# Patient Record
Sex: Female | Born: 1955
Health system: Southern US, Community
[De-identification: ages and names within clinical notes are randomized; demographics above are authoritative.]

## PROBLEM LIST (undated history)

## (undated) DIAGNOSIS — E079 Disorder of thyroid, unspecified: Secondary | ICD-10-CM

## (undated) DIAGNOSIS — C801 Malignant (primary) neoplasm, unspecified: Secondary | ICD-10-CM

## (undated) DIAGNOSIS — E271 Primary adrenocortical insufficiency: Secondary | ICD-10-CM

## (undated) DIAGNOSIS — E785 Hyperlipidemia, unspecified: Secondary | ICD-10-CM

## (undated) DIAGNOSIS — Z5189 Encounter for other specified aftercare: Secondary | ICD-10-CM

## (undated) HISTORY — DX: Disorder of thyroid, unspecified: E07.9

## (undated) HISTORY — PX: KIDNEY SURGERY: SHX687

## (undated) HISTORY — DX: Hyperlipidemia, unspecified: E78.5

## (undated) HISTORY — DX: Malignant (primary) neoplasm, unspecified: C80.1

## (undated) HISTORY — DX: Encounter for other specified aftercare: Z51.89

## (undated) HISTORY — DX: Primary adrenocortical insufficiency: E27.1

---

## 1980-08-10 DIAGNOSIS — Z5189 Encounter for other specified aftercare: Secondary | ICD-10-CM

## 1980-08-10 DIAGNOSIS — IMO0001 Reserved for inherently not codable concepts without codable children: Secondary | ICD-10-CM

## 1980-08-10 HISTORY — DX: Reserved for inherently not codable concepts without codable children: IMO0001

## 1980-08-10 HISTORY — DX: Encounter for other specified aftercare: Z51.89

## 1982-08-10 HISTORY — PX: TUBAL LIGATION: SHX77

## 2004-03-26 ENCOUNTER — Other Ambulatory Visit: Payer: Self-pay

## 2005-12-09 ENCOUNTER — Emergency Department: Payer: Self-pay | Admitting: Emergency Medicine

## 2007-08-11 HISTORY — PX: NEPHRECTOMY: SHX65

## 2007-09-04 ENCOUNTER — Observation Stay: Payer: Self-pay | Admitting: Urology

## 2007-09-26 ENCOUNTER — Ambulatory Visit (HOSPITAL_COMMUNITY): Admission: RE | Admit: 2007-09-26 | Discharge: 2007-09-26 | Payer: Self-pay | Admitting: Urology

## 2007-10-10 ENCOUNTER — Encounter (INDEPENDENT_AMBULATORY_CARE_PROVIDER_SITE_OTHER): Payer: Self-pay | Admitting: Urology

## 2007-10-10 ENCOUNTER — Inpatient Hospital Stay (HOSPITAL_COMMUNITY): Admission: RE | Admit: 2007-10-10 | Discharge: 2007-10-14 | Payer: Self-pay | Admitting: Urology

## 2007-10-15 ENCOUNTER — Emergency Department (HOSPITAL_COMMUNITY): Admission: EM | Admit: 2007-10-15 | Discharge: 2007-10-15 | Payer: Self-pay | Admitting: Emergency Medicine

## 2007-10-16 ENCOUNTER — Ambulatory Visit (HOSPITAL_COMMUNITY): Admission: RE | Admit: 2007-10-16 | Discharge: 2007-10-16 | Payer: Self-pay | Admitting: Emergency Medicine

## 2007-10-16 ENCOUNTER — Ambulatory Visit: Payer: Self-pay | Admitting: Vascular Surgery

## 2007-10-16 ENCOUNTER — Encounter (INDEPENDENT_AMBULATORY_CARE_PROVIDER_SITE_OTHER): Payer: Self-pay | Admitting: Emergency Medicine

## 2008-05-02 ENCOUNTER — Ambulatory Visit (HOSPITAL_COMMUNITY): Admission: RE | Admit: 2008-05-02 | Discharge: 2008-05-02 | Payer: Self-pay | Admitting: Urology

## 2008-06-26 IMAGING — US ULTRASOUND CORE BIOPSY
1 series · 17 of 18 positions shown · non-contrast
Comparison: none

REASON FOR EXAM: kidney aspiration
COMMENTS:

[Series 1: ultrasound core biopsy · 17 of 18 slices shown]
[im 1/18]
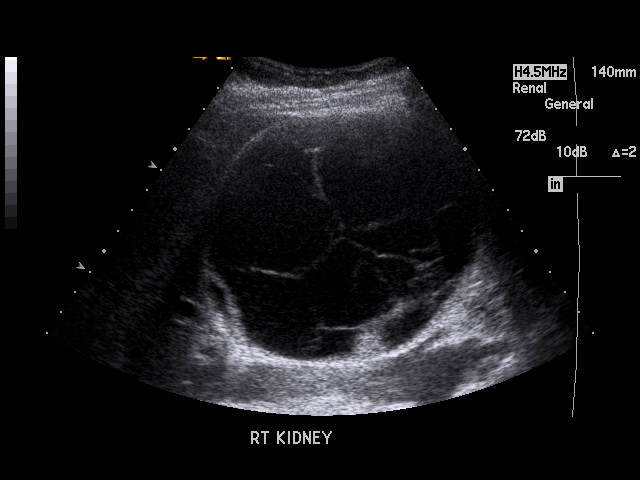
[im 2/18]
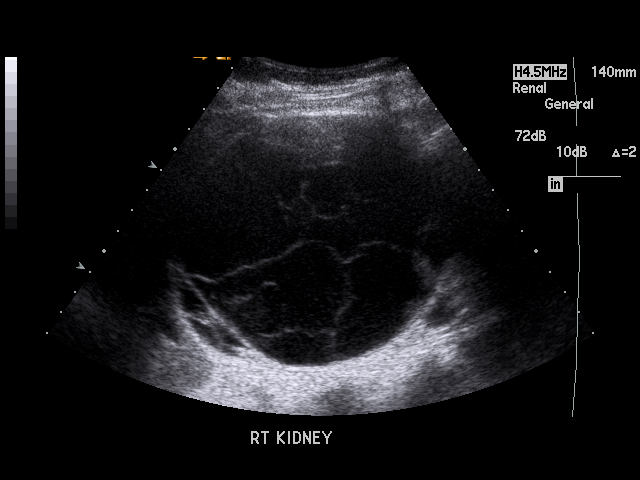
[im 3/18]
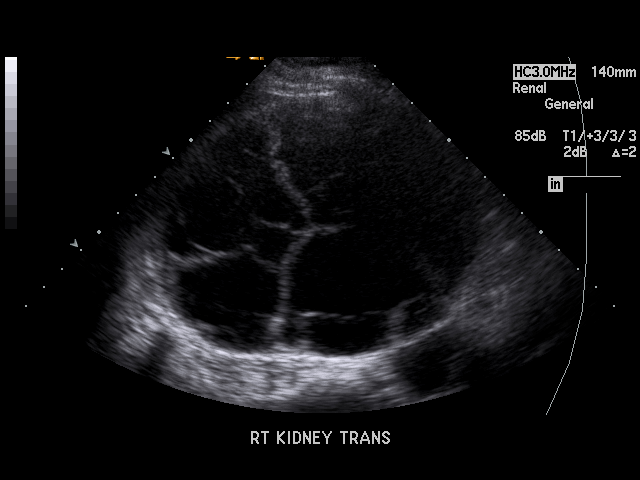
[im 4/18]
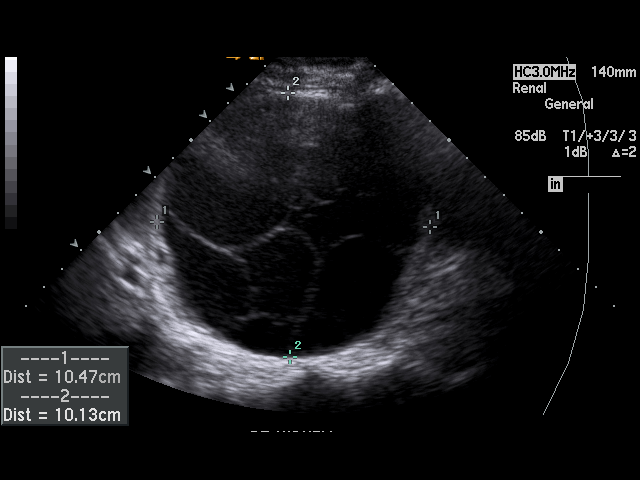
[im 5/18]
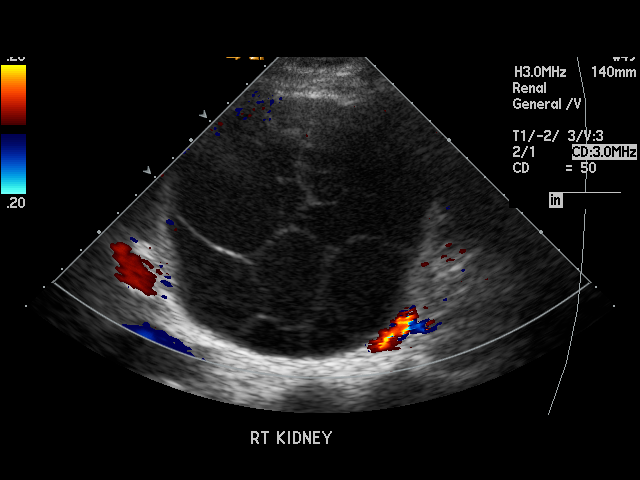
[im 6/18]
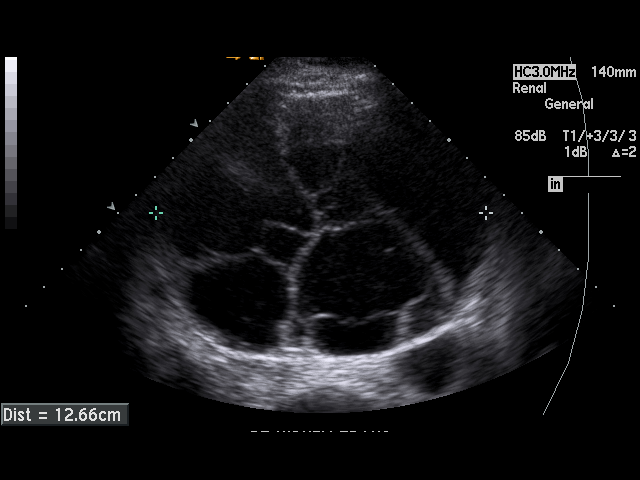
[im 7/18]
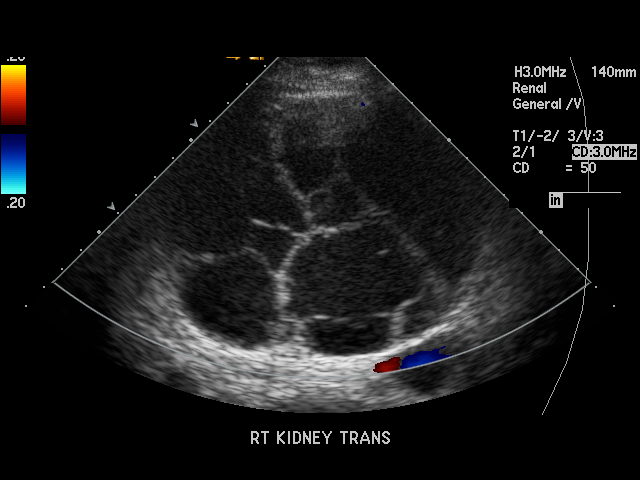
[im 8/18]
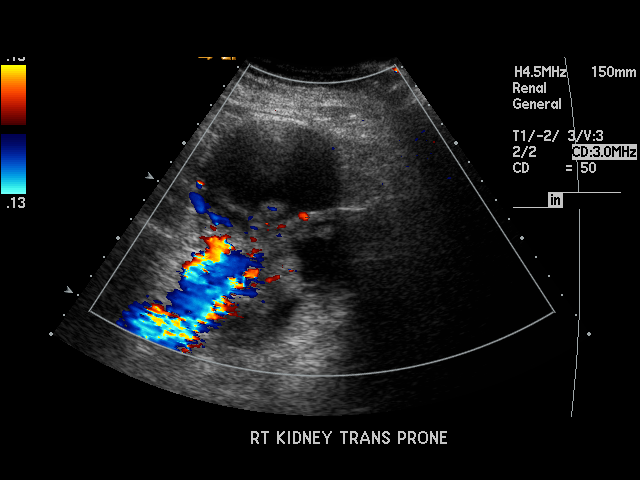
[im 10/18]
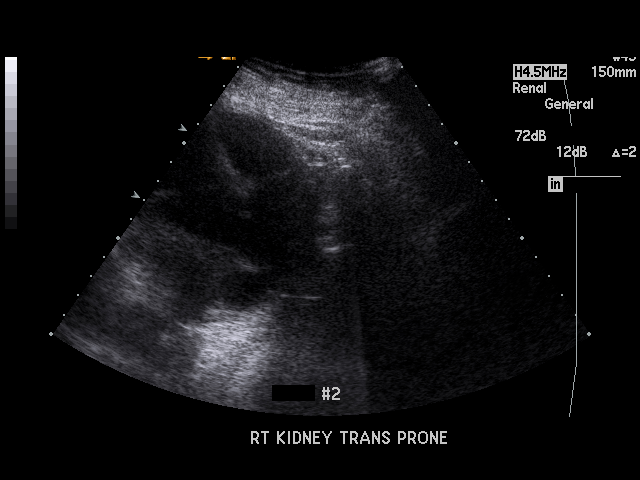
[im 11/18]
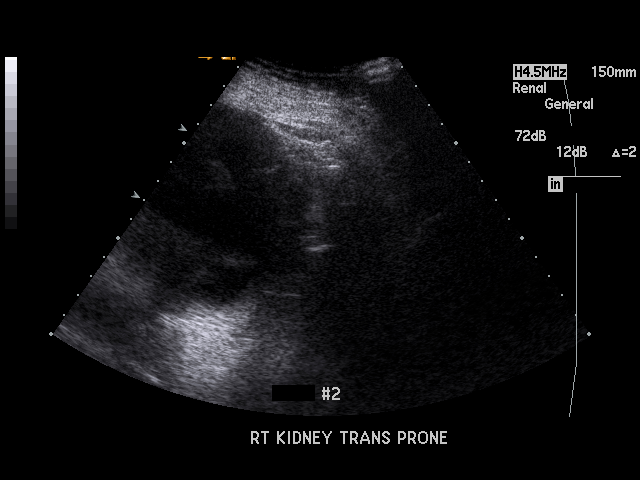
[im 12/18]
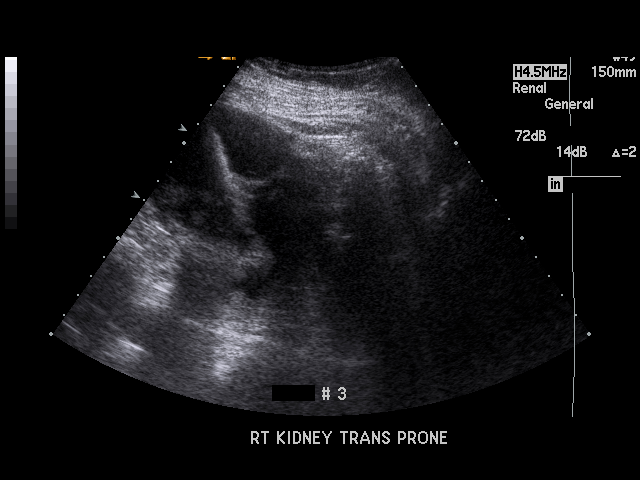
[im 13/18]
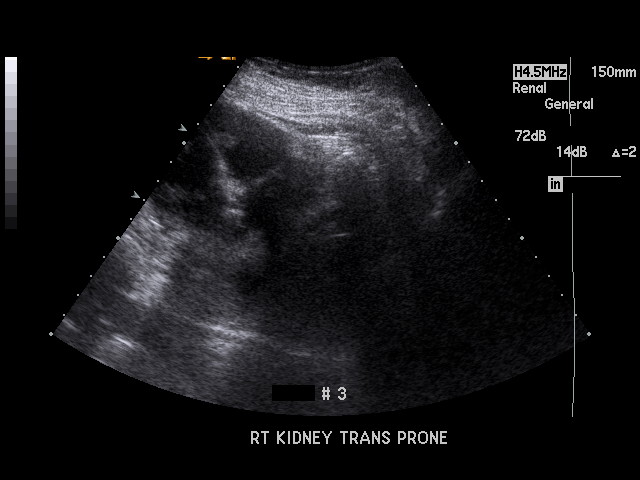
[im 14/18]
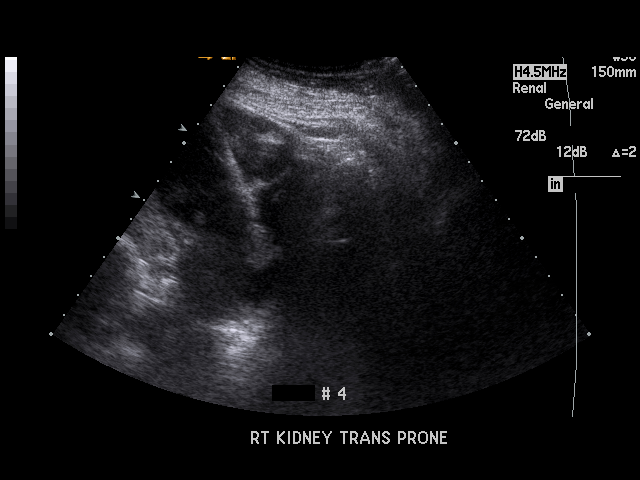
[im 15/18]
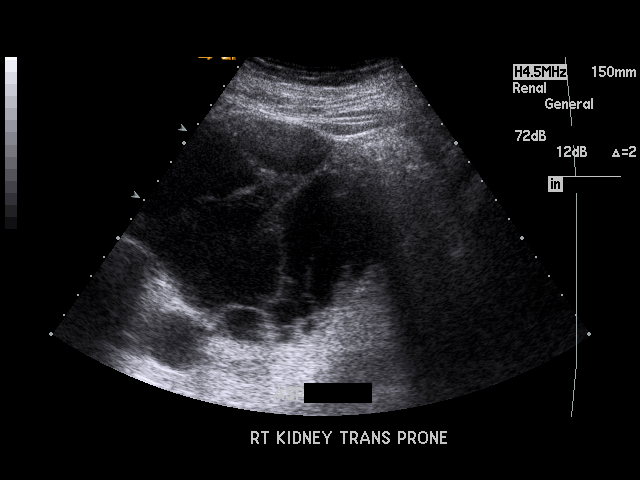
[im 16/18]
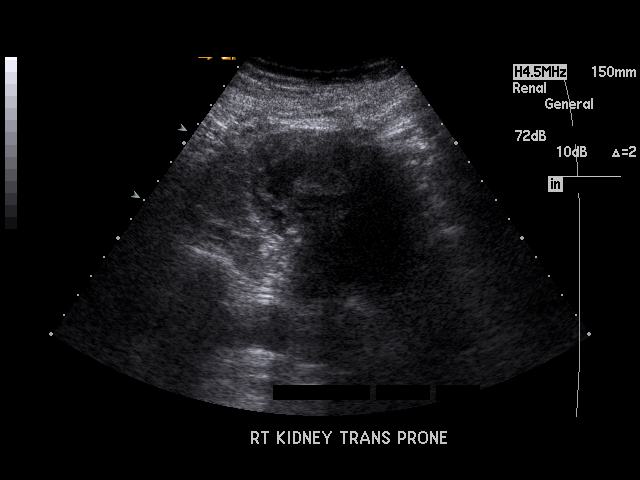
[im 17/18]
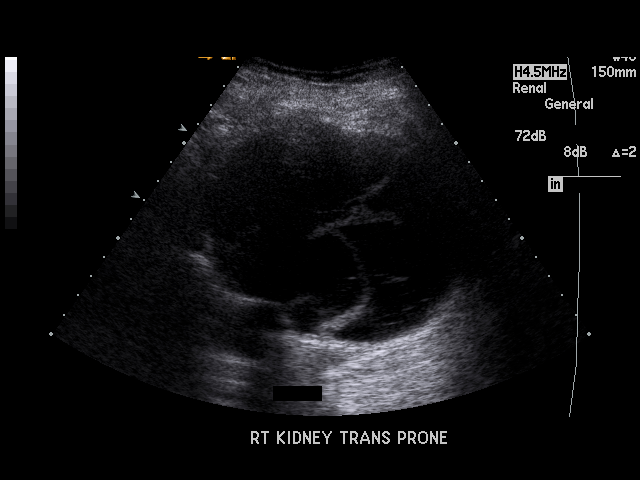
[im 18/18]
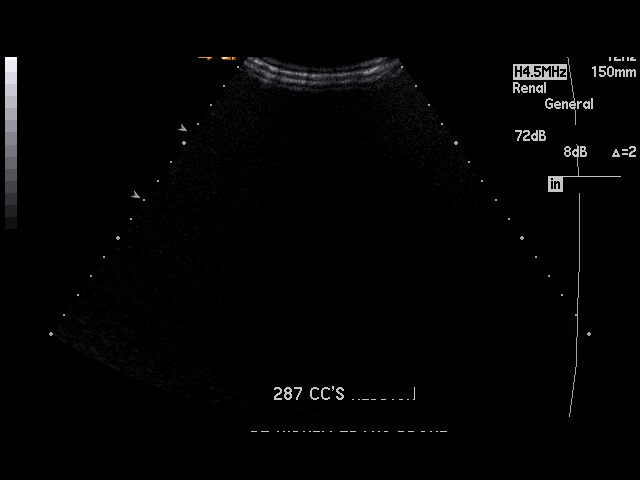

[17 of 18 positions shown; findings below may reference images not displayed]

PROCEDURE:     US  - US GUIDED BX/ASPIRATION NOT BR  - September 06, 2007  [DATE]

RESULT:     Comparison: Abdominal sonogram and abdomen pelvis CT on
09/04/2007.

Procedure/findings:

The procedure, risks, benefits, and alternatives were discussed with the
patient. Informed consent was obtained.

Patient was placed prone. Initial ultrasound of right kidney again shows a
large multiseptated cystic lesion along the upper pole. The lower portion of
the lesion is targeted for biopsy and aspiration via posterior approach.

Conscious sedation was initiated. The right back was prepped and draped in
the usual sterile fashion. Local anesthesia with 1% lidocaine was given.
Under ultrasound guidance, a 17-gauge introducer was positioned into the
lower portion of the complex cystic right upper pole lesion adjacent to a
thickened, vascular septa.  Four passes were made through the thickened
septa with an 18-gauge biopsy device resulting several small tissue
fragments. Samples were placed in formalin and sent to pathology for further
evaluation.

Subsequently the cystic lesion was aspirated using the introducer.
Approximately 290 cc of dark red fluid was obtained after multiple
introducer position changes. The lower portion of the lesion is mostly
decompressed while the upper portion is similar to prior. Portion of the
fluid was sent to the pathology lab for further evaluation.

The needle was removed. Patient tolerated procedure without immediate
complications.

IV medications: Versed 5 mg, Fentanyl 100 mcg.
IMPRESSION: 1. Core biopsy and aspiration of large complex, multiseptated, cystic right
upper pole renal lesion are performed as detailed above.

## 2008-11-02 ENCOUNTER — Ambulatory Visit (HOSPITAL_COMMUNITY): Admission: RE | Admit: 2008-11-02 | Discharge: 2008-11-02 | Payer: Self-pay | Admitting: Urology

## 2009-05-08 ENCOUNTER — Ambulatory Visit (HOSPITAL_COMMUNITY): Admission: RE | Admit: 2009-05-08 | Discharge: 2009-05-08 | Payer: Self-pay | Admitting: Urology

## 2010-03-18 ENCOUNTER — Ambulatory Visit: Payer: Self-pay | Admitting: General Practice

## 2010-03-25 ENCOUNTER — Ambulatory Visit: Payer: Self-pay | Admitting: General Practice

## 2010-04-12 ENCOUNTER — Ambulatory Visit: Payer: Self-pay | Admitting: Internal Medicine

## 2010-07-01 ENCOUNTER — Ambulatory Visit (HOSPITAL_COMMUNITY)
Admission: RE | Admit: 2010-07-01 | Discharge: 2010-07-01 | Payer: Self-pay | Source: Home / Self Care | Admitting: Urology

## 2010-10-21 ENCOUNTER — Ambulatory Visit: Payer: Self-pay | Admitting: General Surgery

## 2010-12-23 NOTE — Discharge Summary (Signed)
NAMEVENTURA, LEGGITT NO.:  [id number]   MEDICAL RECORD NO.:  [id number]          PATIENT TYPE:  INP   LOCATION:  1423                         FACILITY:  [facility]   PHYSICIAN:  Heloise Purpura, MD      DATE OF BIRTH:  10/28/1955   DATE OF ADMISSION:  10/10/2007  DATE OF DISCHARGE:  10/14/2007                               DISCHARGE SUMMARY   ADMISSION DIAGNOSIS:  Right renal mass.   DISCHARGE DIAGNOSIS:  Renal cell carcinoma.   HISTORY AND PHYSICAL:  For full details please see admission history and  physical.  Briefly, Ms. Hunnicutt is a 55 year old female who was found to  have a large cystic complex right renal mass on an evaluation for  abdominal pain.  She underwent a biopsy of this which demonstrated  findings concerning for renal cell carcinoma.  A metastatic evaluation  was negative and after discussion regarding treatment options she  elected to proceed with a right nephrectomy.   HOSPITAL COURSE:  On October 10, 2007, the patient was taken to the  operating room and underwent a right radical nephrectomy.  She tolerated  this procedure well and without complications.  Postoperatively, she was  able to be transferred to a regular hospital room following recovery  from anesthesia.  She remained hemodynamically stable and her hemoglobin  on postoperative day #1 was 10.4.  Her renal function remained stable  with a serum creatinine ranging between 0.8 and 0.90.  She had an  epidural catheter placed for pain management which provided excellent  postoperative pain control.  On postoperative day #2, she began to have  some signs of return of bowel function and was begun on clear liquid  diet; however, she did experience some nausea and, therefore, her diet  was held on the evening of postoperative day #2.  On postoperative day  #3, she had began ambulating as her epidural catheter was removed.  Her  Foley catheter was also subsequently removed.  She did begin passing  flatus and actually had a bowel movement on postoperative day #3.  Her  diet was advanced to a full liquid diet on the afternoon of  postoperative day #3, which she tolerated without difficulty.  Her pain  was controlled with oral pain medication and by the morning of  postoperative day #4, she was able to tolerate a regular diet and was  ready to be discharged home.   Ms. Kubota did receive perioperative stress dose steroids due to her  history of chronic steroid use for adrenal insufficiency.  This was  managed under the care of Dr. Adrian Prince.   PATHOLOGY:  Ms. Cranmore' pathology did return while she was in the  hospital and demonstrated an organ-confined pT2 NX MX Fuhrman grade 2  clear cell renal cell carcinoma with negative margins.  This was  discussed with the patient during her hospitalization.   DISPOSITION:  Home.   DISCHARGE MEDICATIONS:  She was instructed to resume her regular home  medications.  She was instructed to continue taking 40 mg of  hydrocortisone in the morning with 20 mg  in the evening, as instructed  by Dr. Evlyn Kanner.  Further changes of her hydrocortisone will be managed by  Dr. Evlyn Kanner as an outpatient.  She was given a prescription to take  Percocet as needed for pain and Colace as a stool softener.  In  addition, due to complaints of gastroesophageal reflux, she was given a  prescription for Nexium and will plan to follow up with Dr. Evlyn Kanner for  further evaluation of this as an outpatient.   DISCHARGE INSTRUCTIONS:  She was instructed to be ambulatory but  specifically told to refrain from any heavy lifting, strenuous activity,  or driving.  She was instructed to continue to gradually advance her  diet over the course of the next few days.   FOLLOW UP:  Ms. Rather will follow up for further postoperative  evaluation and removal of her staples in approximately 10-14 days.      Heloise Purpura, MD  Electronically Signed     LB/MEDQ  D:  10/15/2007   T:  10/17/2007  Job:  [id number]   cc:   Tera Mater. Evlyn Kanner, M.D.  Fax: [phone]

## 2010-12-23 NOTE — Op Note (Signed)
NAMEAMARIYANA, HEACOX NO.:  [id number]   MEDICAL RECORD NO.:  [id number]          PATIENT TYPE:  INP   LOCATION:  1423                         [facility]AC[facility]   PHYSICIAN:  Heloise Purpura, MD      DATE OF BIRTH:  1956/08/10   DATE OF PROCEDURE:  DATE OF DISCHARGE:                               OPERATIVE REPORT   PREOPERATIVE DIAGNOSIS:  Right renal mass.   POSTOPERATIVE DIAGNOSIS:  Right renal mass.   PROCEDURE:  Right radical nephrectomy.   SURGEON:  Dr. Heloise Purpura.   ASSISTANT:  Dr. Delman Kitten.   ANESTHESIA:  General.   COMPLICATIONS:  None.   ESTIMATED BLOOD LOSS:  200 mL.   INTRAVENOUS FLUIDS:  3400 mL of lactated Ringer's.   SPECIMENS:  Right kidney and proximal ureter.   DISPOSITION OF SPECIMEN:  To pathology.   INDICATIONS:  Ms. Templeton is a 55 year old female who was incidentally  found to have a large cystic right renal mass.  She underwent a biopsy  of the cystic mass which was suspicious for a renal cell carcinoma.  She  underwent metastatic evaluation which was negative and after discussing  options for management of her renal mass, elected to proceed with the  above procedure.  Potential risks, benefits, complications, and  alternative options were discussed with the patient in detail and  informed consent was obtained.   DESCRIPTION OF PROCEDURE:  The patient was taken to the operating room  after an epidural was placed for postoperative pain control.  She was  administered preoperative antibiotics and placed in the supine position  with a slight bump under her right posterior costal margin.  A general  anesthetic was induced and she was prepped and draped in the usual  sterile fashion.  A preoperative time-out was performed.  A chevron  incision was then made from the tip of the eleventh rib approximately  two fingerbreadths under the costal margin just across the midline.  This was carried down through the subcutaneous tissues  with  electrocautery.  The anterior rectus fascia and external oblique fascia  were then divided with electrocautery and the underlying muscle layers  of the abdominal wall were divided.  The posterior rectus sheath was  then sharply divided allowing entry into the peritoneal cavity.  Toward  the midline, the falciform ligament was identified and was clamped with  Kelly clamps and divided.  The falciform ligament was then tied off with  0-0 silk sutures.  The incision was then opened along the length of the  skin incision and a self-retaining Bookwalter retractor was placed. The  abdomen was inspected and there was no evidence for intra-abdominal  disease or lymphadenopathy.  The colon was identified and the white line  of Toldt was incised along the length of the ascending colon allowing  the colon to be mobilized medially in the space between Gerota's fascia  and the colonic mesentery to be developed.  The duodenum was identified  and was noted to be fairly adherent to the superior pole renal cyst  which measured approximately 15 cm and corresponded  to the patient's  preoperative MRI.  The duodenum was carefully dissected free utilizing a  Kocher maneuver.  Once the duodenum was reflected, this exposed the  inferior vena cava.  The ureter was identified and lifted anteriorly off  the psoas muscle, allowing the posterior aspect of the kidney to be free  from the underlying psoas muscle.  Dissection proceeded superiorly along  the inferior vena cava and the renal vein was identified and was  significantly displaced due to the cystic mass.  The renal vein was able  to be dissected free using right angle dissection and was isolated.  A  single renal artery was noted on preoperative imaging and this was able  to be palpated just posterior to the renal vein.  It was able to be  isolated with right-angle dissection and was divided after ligation with  multiple 0-0 silk ties and a Hem-o-lok  clip.  This resulted in ischemia  of the kidney and the renal vein was then similarly divided with  multiple 0-0 silk ties and Hem-o-lok clips.  The ureter was divided  between Hem-o-lok clips and the lateral attachments to the kidney were  carefully taken down.  Dissection, continued superiorly along the  inferior vena cava and the cystic mass was freed superiorly allowing the  adrenal gland to be spared as the cystic mass did not appear to be  invading the adrenal gland or in close proximity to the adrenal gland.  The superior attachments and peritoneum of the kidney including the  hepatorenal ligaments were divided and the specimen was removed.  The  renal fossa was copiously irrigated with warm saline irrigation and  hemostasis appeared excellent.  The colon was then allowed to fall back  into its normal anatomic position in the right renal fossa.  Preparations were then made for closure.  A #1 PDS suture was used to  perform a U stitch in the midline of the abdominal wall to reapproximate  the midline.  A running #1 PDS suture was then used to close the  transversalis fascia and internal oblique fascial layers in continuity  with the posterior rectus sheath.  A second anterior layer was then  performed with a running #1 PDS suture to reapproximate the external  oblique fascia in continuity with the anterior rectus sheath.  The  superficial wound was copiously irrigated and reapproximated at the skin  level with staples.  Sterile dressings were applied.  The patient  appeared to tolerate the procedure well and without complications.  She  was able to be extubated and transferred to the recovery unit in  satisfactory condition.      Heloise Purpura, MD  Electronically Signed     LB/MEDQ  D:  10/10/2007  T:  10/11/2007  Job:  [id number]

## 2011-05-01 LAB — BASIC METABOLIC PANEL
Calcium: 8.8
GFR calc Af Amer: >60
Potassium: 3.3 — ABNORMAL LOW
Sodium: 141

## 2011-05-01 LAB — CBC
MCHC: 33.8
MCV: 86.7
Platelets: 369
RDW: 14.1
WBC: 11.1 — ABNORMAL HIGH

## 2011-05-01 LAB — TYPE AND SCREEN: ABO/RH(D): A POS

## 2011-05-04 LAB — BASIC METABOLIC PANEL
BUN: 1 — ABNORMAL LOW
BUN: 1 — ABNORMAL LOW
BUN: 2 — ABNORMAL LOW
BUN: 3 — ABNORMAL LOW
BUN: <1 — ABNORMAL LOW
CO2: 27
CO2: 29
CO2: 29
CO2: 29
CO2: 32
Calcium: 7.8 — ABNORMAL LOW
Calcium: 7.9 — ABNORMAL LOW
Calcium: 8 — ABNORMAL LOW
Calcium: 8.2 — ABNORMAL LOW
Chloride: 102
Chloride: 103
Chloride: 103
Chloride: 105
Chloride: 109
Creatinine, Ser: 0.83
Creatinine, Ser: 0.84
Creatinine, Ser: 0.9
Creatinine, Ser: 0.91
GFR calc Af Amer: >60
GFR calc Af Amer: >60
GFR calc Af Amer: >60
GFR calc Af Amer: >60
GFR calc non Af Amer: >60
GFR calc non Af Amer: >60
GFR calc non Af Amer: >60
GFR calc non Af Amer: >60
Glucose, Bld: 114 — ABNORMAL HIGH
Glucose, Bld: 116 — ABNORMAL HIGH
Glucose, Bld: 130 — ABNORMAL HIGH
Glucose, Bld: 76
Glucose, Bld: 96
Potassium: 2.9 — ABNORMAL LOW
Potassium: 3 — ABNORMAL LOW
Potassium: 3.1 — ABNORMAL LOW
Potassium: 3.5
Potassium: 3.7
Sodium: 138
Sodium: 141
Sodium: 142
Sodium: 142

## 2011-05-04 LAB — HEMOGLOBIN AND HEMATOCRIT, BLOOD: HCT: 30.1 — ABNORMAL LOW

## 2011-08-30 ENCOUNTER — Inpatient Hospital Stay: Payer: Self-pay | Admitting: Student

## 2011-08-30 LAB — CBC
HCT: 41.1 % (ref 35.0–47.0)
HGB: 14.1 g/dL (ref 12.0–16.0)
MCH: 31.1 pg (ref 26.0–34.0)
MCV: 91 fL (ref 80–100)
Platelet: 184 10*3/uL (ref 150–440)
RBC: 4.53 10*6/uL (ref 3.80–5.20)
WBC: 8.2 10*3/uL (ref 3.6–11.0)

## 2011-08-30 LAB — URINALYSIS, COMPLETE
Bilirubin,UR: NEGATIVE
Blood: NEGATIVE
Glucose,UR: NEGATIVE mg/dL (ref 0–75)
Hyaline Cast: 1
Leukocyte Esterase: NEGATIVE
Ph: 5 (ref 4.5–8.0)
Protein: NEGATIVE
Specific Gravity: 1.019 (ref 1.003–1.030)
Squamous Epithelial: <1
WBC UR: 1 /HPF (ref 0–5)

## 2011-08-30 LAB — DRUG SCREEN, URINE
Amphetamines, Ur Screen: NEGATIVE (ref ?–1000)
Benzodiazepine, Ur Scrn: NEGATIVE (ref ?–200)
Cannabinoid 50 Ng, Ur ~~LOC~~: NEGATIVE (ref ?–50)
MDMA (Ecstasy)Ur Screen: NEGATIVE (ref ?–500)
Methadone, Ur Screen: NEGATIVE (ref ?–300)
Phencyclidine (PCP) Ur S: NEGATIVE (ref ?–25)
Tricyclic, Ur Screen: NEGATIVE (ref ?–1000)

## 2011-08-30 LAB — COMPREHENSIVE METABOLIC PANEL
Creatinine: 1.57 mg/dL — ABNORMAL HIGH (ref 0.60–1.30)
EGFR (African American): 44 — ABNORMAL LOW
EGFR (Non-African Amer.): 36 — ABNORMAL LOW
Glucose: 85 mg/dL (ref 65–99)
Potassium: 3.5 mmol/L (ref 3.5–5.1)
Sodium: 135 mmol/L — ABNORMAL LOW (ref 136–145)

## 2011-08-30 LAB — ETHANOL
Ethanol %: <0.003 % (ref 0.000–0.080)
Ethanol: <3 mg/dL

## 2011-08-30 LAB — TSH: Thyroid Stimulating Horm: 3.57 u[IU]/mL

## 2011-08-30 LAB — RAPID INFLUENZA A&B ANTIGENS

## 2011-08-30 LAB — TROPONIN I: Troponin-I: <0.02 ng/mL

## 2011-08-31 LAB — CBC WITH DIFFERENTIAL/PLATELET
Basophil #: 0 10*3/uL (ref 0.0–0.1)
Basophil %: 0 %
Eosinophil #: 0 10*3/uL (ref 0.0–0.7)
Eosinophil %: 0 %
HCT: 35.9 % (ref 35.0–47.0)
HGB: 12.3 g/dL (ref 12.0–16.0)
Lymphocyte #: 0.7 10*3/uL — ABNORMAL LOW (ref 1.0–3.6)
Lymphocyte %: 7.8 %
MCHC: 34.2 g/dL (ref 32.0–36.0)
MCV: 90 fL (ref 80–100)
Monocyte #: 0.3 10*3/uL (ref 0.0–0.7)
Monocyte %: 3.4 %
Neutrophil %: 88.8 %
RBC: 3.98 10*6/uL (ref 3.80–5.20)
WBC: 8.5 10*3/uL (ref 3.6–11.0)

## 2011-08-31 LAB — BASIC METABOLIC PANEL
Anion Gap: 13 (ref 7–16)
BUN: 14 mg/dL (ref 7–18)
Chloride: 109 mmol/L — ABNORMAL HIGH (ref 98–107)
Co2: 23 mmol/L (ref 21–32)
Creatinine: 1.43 mg/dL — ABNORMAL HIGH (ref 0.60–1.30)
EGFR (African American): 49 — ABNORMAL LOW
Glucose: 193 mg/dL — ABNORMAL HIGH (ref 65–99)
Osmolality: 294 (ref 275–301)
Sodium: 145 mmol/L (ref 136–145)

## 2011-09-01 LAB — BASIC METABOLIC PANEL
Calcium, Total: 7.9 mg/dL — ABNORMAL LOW (ref 8.5–10.1)
Chloride: 111 mmol/L — ABNORMAL HIGH (ref 98–107)
Co2: 25 mmol/L (ref 21–32)
EGFR (African American): >60
Glucose: 109 mg/dL — ABNORMAL HIGH (ref 65–99)
Osmolality: 294 (ref 275–301)
Potassium: 3.5 mmol/L (ref 3.5–5.1)
Sodium: 148 mmol/L — ABNORMAL HIGH (ref 136–145)

## 2011-09-01 LAB — CBC WITH DIFFERENTIAL/PLATELET
Basophil %: 0.2 %
Eosinophil %: 0 %
HGB: 11.6 g/dL — ABNORMAL LOW (ref 12.0–16.0)
Lymphocyte #: 1.3 10*3/uL (ref 1.0–3.6)
Lymphocyte %: 12.9 %
MCHC: 33.7 g/dL (ref 32.0–36.0)
Monocyte #: 1.2 10*3/uL — ABNORMAL HIGH (ref 0.0–0.7)
Monocyte %: 11.3 %
Neutrophil %: 75.6 %
WBC: 10.2 10*3/uL (ref 3.6–11.0)

## 2011-09-05 LAB — CULTURE, BLOOD (SINGLE)

## 2011-10-05 ENCOUNTER — Ambulatory Visit: Payer: Self-pay | Admitting: General Practice

## 2011-11-03 ENCOUNTER — Encounter: Payer: Self-pay | Admitting: Internal Medicine

## 2011-12-15 ENCOUNTER — Ambulatory Visit (AMBULATORY_SURGERY_CENTER): Payer: No Typology Code available for payment source | Admitting: *Deleted

## 2011-12-15 VITALS — Ht 66.0 in | Wt 165.0 lb

## 2011-12-15 DIAGNOSIS — Z1211 Encounter for screening for malignant neoplasm of colon: Secondary | ICD-10-CM

## 2011-12-15 MED ORDER — PEG-KCL-NACL-NASULF-NA ASC-C 100 G PO SOLR: ORAL | Status: DC

## 2011-12-23 ENCOUNTER — Encounter: Payer: Self-pay | Admitting: Internal Medicine

## 2011-12-23 ENCOUNTER — Ambulatory Visit (AMBULATORY_SURGERY_CENTER): Payer: No Typology Code available for payment source | Admitting: Internal Medicine

## 2011-12-23 VITALS — BP 118/60 | HR 67 | Temp 97.8°F | Resp 20 | Ht 66.0 in | Wt 165.0 lb

## 2011-12-23 DIAGNOSIS — K5730 Diverticulosis of large intestine without perforation or abscess without bleeding: Secondary | ICD-10-CM

## 2011-12-23 DIAGNOSIS — Z1211 Encounter for screening for malignant neoplasm of colon: Secondary | ICD-10-CM

## 2011-12-23 MED ORDER — SODIUM CHLORIDE 0.9 % IV SOLN: 500.0000 mL | INTRAVENOUS | Status: DC

## 2011-12-23 NOTE — Patient Instructions (Signed)
Moderate diverticulosis, internal and external hemorrhoids.  Otherwise normal exam.  Repeat colonoscopy in 10 years.  YOU HAD AN ENDOSCOPIC PROCEDURE TODAY AT THE [facility]: Refer to the procedure report that was given to you for any specific questions about what was found during the examination.  If the procedure report does not answer your questions, please call your gastroenterologist to clarify.  If you requested that your care partner not be given the details of your procedure findings, then the procedure report has been included in a sealed envelope for you to review at your convenience later.  YOU SHOULD EXPECT: Some feelings of bloating in the abdomen. Passage of more gas than usual.  Walking can help get rid of the air that was put into your GI tract during the procedure and reduce the bloating. If you had a lower endoscopy (such as a colonoscopy or flexible sigmoidoscopy) you may notice spotting of blood in your stool or on the toilet paper. If you underwent a bowel prep for your procedure, then you may not have a normal bowel movement for a few days.  DIET: Your first meal following the procedure should be a light meal and then it is ok to progress to your normal diet.  A half-sandwich or bowl of soup is an example of a good first meal.  Heavy or fried foods are harder to digest and may make you feel nauseous or bloated.  Likewise meals heavy in dairy and vegetables can cause extra gas to form and this can also increase the bloating.  Drink plenty of fluids but you should avoid alcoholic beverages for 24 hours.  ACTIVITY: Your care partner should take you home directly after the procedure.  You should plan to take it easy, moving slowly for the rest of the day.  You can resume normal activity the day after the procedure however you should NOT DRIVE or use heavy machinery for 24 hours (because of the sedation medicines used during the test).    SYMPTOMS TO REPORT IMMEDIATELY: A  gastroenterologist can be reached at any hour.  During normal business hours, 8:30 AM to 5:00 PM Monday through Friday, call [phone].  After hours and on weekends, please call the GI answering service at [phone] who will take a message and have the physician on call contact you.   Following lower endoscopy (colonoscopy or flexible sigmoidoscopy):  Excessive amounts of blood in the stool  Significant tenderness or worsening of abdominal pains  Swelling of the abdomen that is new, acute  Fever of 100F or higher  FOLLOW UP: If any biopsies were taken you will be contacted by phone or by letter within the next 1-3 weeks.  Call your gastroenterologist if you have not heard about the biopsies in 3 weeks.  Our staff will call the home number listed on your records the next business day following your procedure to check on you and address any questions or concerns that you may have at that time regarding the information given to you following your procedure. This is a courtesy call and so if there is no answer at the home number and we have not heard from you through the emergency physician on call, we will assume that you have returned to your regular daily activities without incident.  SIGNATURES/CONFIDENTIALITY: You and/or your care partner have signed paperwork which will be entered into your electronic medical record.  These signatures attest to the fact that that the information above on your  After Visit Summary has been reviewed and is understood.  Full responsibility of the confidentiality of this discharge information lies with you and/or your care-partner.

## 2011-12-23 NOTE — Progress Notes (Signed)
Patient did not experience any of the following events: a burn prior to discharge; a fall within the facility; wrong site/side/patient/procedure/implant event; or a hospital transfer or hospital admission upon discharge from the facility. (G8907) Patient did not have preoperative order for IV antibiotic SSI prophylaxis. (G8918)  

## 2011-12-23 NOTE — Op Note (Signed)
[facility] [address] [place], [place]  [address]  COLONOSCOPY PROCEDURE REPORT  PATIENT:  Caitlin Allison, Caitlin Allison  MR#:  [id number] BIRTHDATE:  [birth year], 56 yrs. old  GENDER:  female ENDOSCOPIST:  Wilhemina Bonito. Eda Keys, MD REF. BY:  Adrian Prince, M.D. PROCEDURE DATE:  12/23/2011 PROCEDURE:  Average-risk screening colonoscopy G0121 ASA CLASS:  Class II INDICATIONS:  Routine Risk Screening MEDICATIONS:   MAC sedation, administered by CRNA, propofol (Diprivan) 270 mg IV  DESCRIPTION OF PROCEDURE:   After the risks benefits and alternatives of the procedure were thoroughly explained, informed consent was obtained.  Digital rectal exam was performed and revealed no abnormalities.   The LB CF-[id number] endoscope was introduced through the anus and advanced to the cecum, which was identified by both the appendix and ileocecal valve, without limitations.  The quality of the prep was excellent, using MoviPrep.  The instrument was then slowly withdrawn as the colon was fully examined. <<PROCEDUREIMAGES>>  FINDINGS:  The terminal ileum appeared normal.  Moderate diverticulosis was found in the sigmoid colon.  Otherwise normal colonoscopy without  polyps, masses, vascular ectasias, or inflammatory changes.   Retroflexed views in the rectum revealed internal hemorrhoids.    The time to cecum =  2  minutes. The scope was then withdrawn in  12  minutes from the cecum and the procedure completed.  COMPLICATIONS:  None  ENDOSCOPIC IMPRESSION: 1) Normal terminal ileum 2) Moderate diverticulosis in the sigmoid colon 3) Otherwise normal colonoscopy 4) Internal and external hemorrhoids  RECOMMENDATIONS: 1) Continue current colorectal screening recommendations for "routine risk" patients with a repeat colonoscopy in 10 years.  ______________________________ Wilhemina Bonito. Eda Keys, MD  CC:  Adrian Prince, MD;  The Patient  n. eSIGNED:   Wilhemina Bonito. Eda Keys at 12/23/2011 12:10  PM  [place], [phone]

## 2011-12-24 ENCOUNTER — Telehealth: Payer: Self-pay | Admitting: *Deleted

## 2011-12-24 NOTE — Telephone Encounter (Signed)
  Follow up Call-  Call back number 12/23/2011  Post procedure Call Back phone  # [phone]  Permission to leave phone message Yes     Patient questions:  Do you have a fever, pain , or abdominal swelling? no Pain Score  0 *  Have you tolerated food without any problems? yes  Have you been able to return to your normal activities? yes  Do you have any questions about your discharge instructions: Diet   no Medications  no Follow up visit  no  Do you have questions or concerns about your Care? no  Actions: * If pain score is 4 or above: No action needed, pain <4.

## 2012-02-02 ENCOUNTER — Ambulatory Visit (HOSPITAL_COMMUNITY)
Admission: RE | Admit: 2012-02-02 | Discharge: 2012-02-02 | Disposition: A | Discharge status: Home / Self Care | Payer: No Typology Code available for payment source | Source: Ambulatory Visit | Attending: Urology | Admitting: Urology

## 2012-02-02 ENCOUNTER — Other Ambulatory Visit (HOSPITAL_COMMUNITY): Payer: Self-pay | Admitting: Urology

## 2012-02-02 DIAGNOSIS — C649 Malignant neoplasm of unspecified kidney, except renal pelvis: Secondary | ICD-10-CM

## 2012-02-02 DIAGNOSIS — J438 Other emphysema: Secondary | ICD-10-CM | POA: Insufficient documentation

## 2012-10-28 ENCOUNTER — Other Ambulatory Visit (HOSPITAL_COMMUNITY): Payer: Self-pay | Admitting: Urology

## 2012-10-28 ENCOUNTER — Ambulatory Visit (HOSPITAL_COMMUNITY)
Admission: RE | Admit: 2012-10-28 | Discharge: 2012-10-28 | Disposition: A | Discharge status: Home / Self Care | Payer: No Typology Code available for payment source | Source: Ambulatory Visit | Attending: Urology | Admitting: Urology

## 2012-10-28 DIAGNOSIS — C649 Malignant neoplasm of unspecified kidney, except renal pelvis: Secondary | ICD-10-CM

## 2014-12-02 NOTE — Discharge Summary (Signed)
PATIENT NAME:  Caitlin Allison, Caitlin Allison MR#:  [id number] DATE OF BIRTH:  1956-07-26  DATE OF ADMISSION:  08/30/2011 DATE OF DISCHARGE:  09/02/2011  PRIMARY ENDOCRINOLOGIST: Dr. Reynold Bowen, [facility]   CHIEF COMPLAINT: Altered mental status, fever.   DISCHARGE DIAGNOSES:  1. Influenza. 2. Pneumonia. 3. Addison's disease with crisis. 4. Acute renal failure.  5. Hypothyroidism.   DISCHARGE MEDICATIONS:  1. Tamiflu 75 mg b.i.d. for three doses. 2. Levaquin 750 mg daily for four days. 3. Hydrocortisone 30 mg t.i.d. for two days, then 20 mg t.i.d.  4. Synthroid 137 mcg daily.   DIET: Regular diet.   ACTIVITY: As tolerated.   FOLLOW-UP: Please follow-up with your PCP within 1 to 2 weeks and check your BMP within one week. Please take increased doses of hydrocortisone when ill and discuss with your endocrinologist prior to upping your hydrocortisone.   HISTORY OF PRESENT ILLNESS: The patient is a 59 year old Caucasian female with history of hypothyroidism and Addison's disease who was brought in because of lethargy and fever. For full details, please refer to the history and physical dictated on 08/30/2011 by Dr. Tressia Miners.   The patient was found to be hypotensive and given IV fluids, initially needed pressors. She tested positive for Influenza. She was admitted to Critical Care Unit on Levophed drip. The patient was also found with a pneumonia and started on broad-spectrum antibiotics.   SIGNIFICANT LABS/STUDIES: Initial creatinine 1.57, on discharge 0.98. Troponin negative. TSH 3.57. Free thyroxine 1.12. Urine tox was negative. Initial WBC 8.2. Influenza positive for Influenza type A. Blood cultures have been negative to date. Urinalysis no evidence of infection. Cortisol level on arrival was low at 0.6. CT of the head without contrast showed no acute intracranial process. CT of the chest without contrast showing patchy areas of increased density in the posterior costophrenic  gutters bilaterally in the lingula, subsegmental atelectasis or early infiltrate. Initial x-ray of the chest shows shallow inspiration interstitial infiltrate, atelectasis versus infiltrate within the lung bases right greater than the left.   HOSPITAL COURSE: The patient was admitted to CCU and was started on IV antibiotics, Tamiflu, and IV fluids. Furthermore, Dr. Gabriel Carina from Endocrinology was consulted. She was started on IV steroids as well and initial cortisol level was low. The patient had previously been told that if there is in an illness please up the hydrocortisone, however, the patient stated that she had not done that. The patient also had mild acute renal failure which corrected with IV fluids. Blood cultures have been negative and the patient has remained afebrile. At this point, antibiotics will be tapered down to Levaquin for an additional four days. Her steroids have been tapered off from stress dose steroids to 30 mg p.o. t.i.d. and this should be continued for an additional two days and tapered off to 20 mg t.i.d. She is to follow-up with her outpatient PCP and endocrinologist. She is also to check a BMP within a week.   DISPOSITION: Home.   TOTAL TIME SPENT: 35 minutes.   CODE STATUS: The patient is FULL CODE.   ____________________________ Vivien Presto, MD sa:drc D: 09/02/2011 12:03:08 ET T: 09/02/2011 13:40:52 ET JOB#: [id number]  cc: Vivien Presto, MD, <Dictator> Dr. Reynold Bowen, [facility] MD ELECTRONICALLY SIGNED 09/08/2011 18:36

## 2014-12-02 NOTE — H&P (Signed)
PATIENT NAME:  Caitlin Allison, Caitlin Allison MR#:  [id number] DATE OF BIRTH:  [birth year]  DATE OF ADMISSION:  08/30/2011  ADMITTING PHYSICIAN: Dr. Gladstone Lighter. PRIMARY ENDOCRINOLOGIST: Dr. Reynold Bowen, [facility].  CHIEF COMPLAINT: Altered mental status and fever.   HISTORY OF PRESENT ILLNESS: Caitlin Allison is a 59 year old Caucasian female with past medical history significant for hypothyroidism and Addison's disease who was brought in from home due to lethargy and fever. The patient is very lethargic, moaning in bed, hypotensive, not able to provide any history. The patient's sister is at bedside who gives most of the history. According to the patient's sister, the patient lives with her husband at home. She has been doing fine up until this morning. She had a cold a couple of weeks ago and that seems to be improved this week. She worked in the yard yesterday. She was doing perfectly fine, but she was very sleepy this morning and felt extremely weak and could not get up until later this morning. Even after getting up, she felt hot. They did not check a temperature, but her mental status has been gradually going downhill and she became lethargic so was brought to the Emergency Room. On the way to the ER, she also had a couple of episodes of vomiting. She has been hypotensive in the Emergency Room. Initial blood pressure 95/42, but that dropped down to systolic in the 37J. She did receive IV hydrocortisone injection and also two liters of IV fluid boluses in spite of which her blood pressure remained low. Her influenza test is positive and she does have right lower lobe pneumonia. She will be admitted to Critical Care Unit for sepsis and will be on a Levophed drip.   PAST MEDICAL HISTORY:  1. Hypothyroidism.  2. Addison's disease.  3. Right renal cell carcinoma.   PAST SURGICAL HISTORY: Right nephrectomy secondary to renal cell carcinoma.   ALLERGIES TO MEDICATIONS: No known medication  allergies.   HOME MEDICATIONS: 1. Synthroid 100 mcg p.o. daily. The patient's sister is not sure of the exact dosage.  2. Hydrocortisone p.o. 20 mg p.o. in the morning and 10 mg in the evening.   SOCIAL HISTORY: Lives at home with her husband. No history of any smoking or alcohol. Works as a Engineer, structural.   FAMILY HISTORY: Positive for thyroid disease in the family. No significant cancer, heart disease or strokes in the family.   REVIEW OF SYSTEMS: Difficult to be obtained secondary to the patient's mental status.   PHYSICAL EXAMINATION:  VITAL SIGNS: Temperature 102.5 degrees, pulse 102, blood pressure 60/39. Pulse oximetry is 94% on 2 liters. Respiratory rate 28.   GENERAL: Heavily-built well nourished female lying in bed moaning and thrashing around in bed.   HEENT: Normocephalic, atraumatic. Pupils equal, round, reacting to light. Anicteric sclerae. Extraocular movements intact. Oropharynx clear without erythema, mass, or exudates. Very dry mucous membranes.   NECK: Supple. No thyromegaly, JVD, or carotid bruits. No lymphadenopathy.   LUNGS: Moving air bilaterally, diffuse rhonchi in the right middle lobe and also right lower lobe. Decreased bibasilar breath sounds. No use of accessory muscles for breathing.   CARDIOVASCULAR: S1, S2 regular rate and rhythm. No murmurs, rubs, or gallops.   ABDOMEN: Soft, nontender, nondistended. No hepatosplenomegaly. Normal bowel sounds.   EXTREMITIES: No pedal edema. No clubbing or cyanosis. 2+ dorsalis pedis pulses palpable.   SKIN: No acne, rash, or lesions.   LYMPHATICS: No cervical lymphadenopathy.   NEUROLOGIC: It is difficult to do  a complete neuro exam because of the patient's altered mental status. She is able to move all four extremities.   PSYCHOLOGICAL: She is lethargic and moaning without opening her eyes.   LABORATORY, RADIOLOGICAL AND DIAGNOSTIC DATA: WBC 8.2, hemoglobin 14.1, hematocrit 41.1, platelet count 184. Sodium 135,  potassium 3.5, chloride 99, bicarbonate 26, BUN 17, creatinine 1.57, glucose 85, calcium 8.8. ALT 18, AST 17, alkaline phosphatase 48, total bili 0.6, albumin 3.7. Influenza test is positive for influenza A. Troponin is negative. CK 54, CK-MB less than 0.04. Alcohol level is negative. Urinalysis negative for any infection.    ASSESSMENT AND PLAN: This is a 59 year old female with history of hypothyroidism and Addison's disease admitted for sepsis with fever and hypotension. Her influenza A is positive and she does have right lower lobe infiltrate on chest x-ray. She also has addisonian crisis with hypotension in spite of hydrocortisone and two liters of IV fluids. She is being admitted to Critical Care Unit for Levophed.  1. Sepsis secondary to pneumonia and also influenza. Continue IV fluids, vasopressors with Levophed antibiotics.  2. Influenza positivity. Give supportive treatment and also Tamiflu.  3. Right lower lobe pneumonia. Blood cultures. Will do broad-spectrum antibiotics with Zosyn, Levaquin and also vancomycin. Once cultures are back and her blood pressure improves and mental status is better, can roll down the antibiotics.  4. Altered mental status, likely secondary to hypotension and also sepsis. Continue to monitor while in Critical Care Unit.  5. Addison's disease with Addison's crisis. Will start IV hydrocortisone 100 mg IV every eight hours. Endocrinology consult. Will get a random cortisol level in the morning and continue to monitor.  6. Hypothyroidism. Follow-up TSH and T4 levels. Continue Synthroid for now and that can be changed to IV Levothyroxine as needed based on endocrinology recommendations.  7. Acute renal failure, probably from partly prerenal and ATN from her sepsis. Continue to monitor especially as she is status post right nephrectomy and only has left kidney.  8. Gastrointestinal and deep vein thrombosis prophylaxis. IV Protonix and TED stockings.  9. CODE STATUS: FULL  CODE.  Plan was discussed with the patient's sister and other family at bedside.   CRITICAL CARE TIME SPENT ON ADMISSION: 60 minutes.  ____________________________ Gladstone Lighter, MD rk:ap D: 08/30/2011 21:41:53 ET               T: 08/31/2011 10:14:33 ET               JOB#: [id number] cc: [facility], Dr. Reynold Bowen Gladstone Lighter MD ELECTRONICALLY SIGNED 09/04/2011 14:45
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
## 2015-07-03 ENCOUNTER — Encounter: Payer: Self-pay | Admitting: Physician Assistant

## 2015-07-03 ENCOUNTER — Ambulatory Visit: Payer: Self-pay | Admitting: Physician Assistant

## 2015-07-03 ENCOUNTER — Other Ambulatory Visit: Payer: Self-pay | Admitting: Physician Assistant

## 2015-07-03 VITALS — BP 100/79 | HR 78 | Temp 97.6°F | Ht 66.0 in | Wt 161.0 lb

## 2015-07-03 DIAGNOSIS — Z299 Encounter for prophylactic measures, unspecified: Secondary | ICD-10-CM

## 2015-07-03 DIAGNOSIS — Z1231 Encounter for screening mammogram for malignant neoplasm of breast: Secondary | ICD-10-CM

## 2015-07-03 NOTE — Progress Notes (Signed)
S: pt here for basic physical, needs yearly labs, hx of addison's disease, nonsmoker, no complaints, says feels well, taking cinnamon, d3, cortisol, and synthroid, nkda; needs yearly mammogram and flu vaccine, had colonoscopy when was 52 which was normal, ros neg  O: vitals wnl, nad,  Appears well Ent:  tms clear, nasal mucosa pink, throat wnl, neck supple no lymph Lungs: Normal effort. Lungs are clear to auscultation, no crackles or wheezes Cardiovascular: Regular rate and rhythm, no edema Abd: soft nontender bs normal all 4 quads Musculoskeletal:  Neurovascularly intact Neurological: No new neurological deficits

## 2015-07-05 LAB — CMP12+LP+TP+TSH+6AC+CBC/D/PLT
ALK PHOS: 52 IU/L (ref 39–117)
ALT: 14 IU/L (ref 0–32)
AST: 15 IU/L (ref 0–40)
Albumin/Globulin Ratio: 1.4 (ref 1.1–2.5)
Albumin: 4.3 g/dL (ref 3.5–5.5)
BASOS ABS: 0 10*3/uL (ref 0.0–0.2)
BILIRUBIN TOTAL: 0.4 mg/dL (ref 0.0–1.2)
BUN / CREAT RATIO: 16 (ref 9–23)
BUN: 15 mg/dL (ref 6–24)
Basos: 0 %
CALCIUM: 9.4 mg/dL (ref 8.7–10.2)
CHLORIDE: 99 mmol/L (ref 97–106)
CHOL/HDL RATIO: 3.4 ratio (ref 0.0–4.4)
Cholesterol, Total: 303 mg/dL — ABNORMAL HIGH (ref 100–199)
Creatinine, Ser: 0.95 mg/dL (ref 0.57–1.00)
EOS (ABSOLUTE): 0.1 10*3/uL (ref 0.0–0.4)
EOS: 1 %
ESTIMATED CHD RISK: 0.5 times avg. (ref 0.0–1.0)
Free Thyroxine Index: 1.6 (ref 1.2–4.9)
GFR calc Af Amer: 76 mL/min/{1.73_m2} (ref >59)
GFR calc non Af Amer: 66 mL/min/{1.73_m2} (ref >59)
GGT: 14 IU/L (ref 0–60)
GLUCOSE: 80 mg/dL (ref 65–99)
Globulin, Total: 3.1 g/dL (ref 1.5–4.5)
HDL: 88 mg/dL (ref >39)
HEMOGLOBIN: 14.5 g/dL (ref 11.1–15.9)
Hematocrit: 43.2 % (ref 34.0–46.6)
Immature Grans (Abs): 0 10*3/uL (ref 0.0–0.1)
Immature Granulocytes: 0 %
Iron: 93 ug/dL (ref 27–159)
LDH: 195 IU/L (ref 119–226)
LDL Calculated: 197 mg/dL — ABNORMAL HIGH (ref 0–99)
LYMPHS ABS: 3 10*3/uL (ref 0.7–3.1)
Lymphs: 35 %
MCH: 31 pg (ref 26.6–33.0)
MCHC: 33.6 g/dL (ref 31.5–35.7)
MCV: 93 fL (ref 79–97)
Monocytes Absolute: 0.7 10*3/uL (ref 0.1–0.9)
Monocytes: 9 %
NEUTROS ABS: 4.6 10*3/uL (ref 1.4–7.0)
Neutrophils: 55 %
PLATELETS: 343 10*3/uL (ref 150–379)
Phosphorus: 3.6 mg/dL (ref 2.5–4.5)
Potassium: 4.4 mmol/L (ref 3.5–5.2)
RBC: 4.67 x10E6/uL (ref 3.77–5.28)
RDW: 13.6 % (ref 12.3–15.4)
SODIUM: 141 mmol/L (ref 136–144)
T3 UPTAKE RATIO: 24 % (ref 24–39)
T4 TOTAL: 6.5 ug/dL (ref 4.5–12.0)
TOTAL PROTEIN: 7.4 g/dL (ref 6.0–8.5)
TSH: 12.63 u[IU]/mL — AB (ref 0.450–4.500)
Triglycerides: 90 mg/dL (ref 0–149)
URIC ACID: 5.1 mg/dL (ref 2.5–7.1)
VLDL Cholesterol Cal: 18 mg/dL (ref 5–40)
WBC: 8.5 10*3/uL (ref 3.4–10.8)

## 2015-07-05 LAB — VITAMIN D 25 HYDROXY (VIT D DEFICIENCY, FRACTURES): VIT D 25 HYDROXY: 20.7 ng/mL — AB (ref 30.0–100.0)

## 2015-07-05 LAB — ACTH: ACTH: 262.2 pg/mL — ABNORMAL HIGH (ref 7.2–63.3)

## 2015-07-05 LAB — B12 AND FOLATE PANEL: VITAMIN B 12: 577 pg/mL (ref 211–946)

## 2015-07-05 LAB — CORTISOL: Cortisol: 0.5 ug/dL

## 2015-07-09 ENCOUNTER — Other Ambulatory Visit: Payer: Self-pay | Admitting: Physician Assistant

## 2015-07-09 ENCOUNTER — Other Ambulatory Visit: Payer: Self-pay

## 2015-07-09 DIAGNOSIS — E038 Other specified hypothyroidism: Secondary | ICD-10-CM

## 2015-07-09 NOTE — Progress Notes (Signed)
Patient came in to have repeat TSH level done per Susan's authorization.

## 2015-07-10 ENCOUNTER — Other Ambulatory Visit: Payer: Self-pay | Admitting: Physician Assistant

## 2015-07-10 LAB — TSH: TSH: 15.71 u[IU]/mL — AB (ref 0.450–4.500)

## 2015-07-10 MED ORDER — LEVOTHYROXINE SODIUM 150 MCG PO TABS: 150.0000 ug | ORAL_TABLET | Freq: Every day | ORAL | Status: DC

## 2015-07-10 NOTE — Progress Notes (Signed)
Spoke with the patient about her lab results and she expressed understanding.  Pt will return in 1 month to recheck her TSH.

## 2015-07-30 NOTE — Addendum Note (Signed)
Addended by: Rudene Anda T on: 07/30/2015 11:48 AM   Modules accepted: Orders

## 2015-07-30 NOTE — Progress Notes (Signed)
Patient ID: Caitlin Allison, female   DOB: 27-Jan-1956, 59 y.o.   MRN: [id number] I scheduled patient to have her yearly mammogram for January 5th at 1:40.

## 2015-07-31 ENCOUNTER — Ambulatory Visit: Payer: Self-pay | Admitting: Physician Assistant

## 2015-07-31 ENCOUNTER — Encounter: Payer: Self-pay | Admitting: Physician Assistant

## 2015-07-31 VITALS — BP 110/70 | HR 81 | Temp 98.2°F

## 2015-07-31 DIAGNOSIS — N390 Urinary tract infection, site not specified: Secondary | ICD-10-CM

## 2015-07-31 DIAGNOSIS — R300 Dysuria: Secondary | ICD-10-CM

## 2015-07-31 DIAGNOSIS — R319 Hematuria, unspecified: Secondary | ICD-10-CM

## 2015-07-31 LAB — POCT URINALYSIS DIPSTICK
Bilirubin, UA: NEGATIVE
Glucose, UA: NEGATIVE
Ketones, UA: NEGATIVE
NITRITE UA: NEGATIVE
PH UA: 6
Protein, UA: NEGATIVE
SPEC GRAV UA: 1.02
UROBILINOGEN UA: 0.2

## 2015-07-31 MED ORDER — CIPROFLOXACIN HCL 250 MG PO TABS: 250.0000 mg | ORAL_TABLET | Freq: Two times a day (BID) | ORAL | Status: DC

## 2015-07-31 NOTE — Progress Notes (Signed)
S:  C/o uti sx for 2 days, urgency, frequency, denies vaginal discharge, abdominal pain or flank pain:  Remainder ros neg  O:  Vitals wnl, nad, no cva tenderness, back nontender, lungs c t a,cv rrr, n/v intact, ua 1+ leuks, trace blood  A: uti  P: cipro 250mg  bid x 7d, increase water intake, add cranberry juice, return if not improving in 2 -3 days, return earlier if worsening, discussed pyelonephritis sx

## 2015-08-15 ENCOUNTER — Encounter: Payer: Self-pay | Admitting: Physician Assistant

## 2015-08-15 ENCOUNTER — Ambulatory Visit: Payer: No Typology Code available for payment source | Attending: Physician Assistant

## 2015-08-15 ENCOUNTER — Ambulatory Visit: Payer: Self-pay | Admitting: Physician Assistant

## 2015-08-15 DIAGNOSIS — N390 Urinary tract infection, site not specified: Secondary | ICD-10-CM

## 2015-08-15 LAB — POCT URINALYSIS DIPSTICK
BILIRUBIN UA: NEGATIVE
GLUCOSE UA: NEGATIVE
Ketones, UA: NEGATIVE
NITRITE UA: NEGATIVE
PH UA: 5.5
Protein, UA: NEGATIVE
RBC UA: NEGATIVE
Spec Grav, UA: 1.025
UROBILINOGEN UA: 0.2

## 2015-08-15 NOTE — Progress Notes (Signed)
S: still having freq, no pain when urinating, some pain in kidney area but better after taking off gun belt, no fever/chills, finished cipro  O: vitals wnl, nad, ua trace leuks  A: urinary freq    P: culture urine prior to prescribing other antibiotic

## 2015-08-16 LAB — URINE CULTURE

## 2015-08-20 ENCOUNTER — Other Ambulatory Visit: Payer: Self-pay | Admitting: Endocrinology

## 2015-08-20 DIAGNOSIS — C649 Malignant neoplasm of unspecified kidney, except renal pelvis: Secondary | ICD-10-CM

## 2015-08-21 ENCOUNTER — Ambulatory Visit
Admission: RE | Admit: 2015-08-21 | Discharge: 2015-08-21 | Disposition: A | Discharge status: Home / Self Care | Payer: Managed Care, Other (non HMO) | Source: Ambulatory Visit | Attending: Endocrinology | Admitting: Endocrinology

## 2015-08-21 ENCOUNTER — Other Ambulatory Visit: Payer: Self-pay

## 2015-08-21 DIAGNOSIS — Z85528 Personal history of other malignant neoplasm of kidney: Secondary | ICD-10-CM | POA: Insufficient documentation

## 2015-08-21 DIAGNOSIS — R109 Unspecified abdominal pain: Secondary | ICD-10-CM | POA: Insufficient documentation

## 2015-08-21 DIAGNOSIS — Z905 Acquired absence of kidney: Secondary | ICD-10-CM | POA: Diagnosis not present

## 2015-08-21 DIAGNOSIS — C649 Malignant neoplasm of unspecified kidney, except renal pelvis: Secondary | ICD-10-CM

## 2015-08-21 MED ORDER — IOHEXOL 300 MG/ML  SOLN
100.0000 mL | Freq: Once | INTRAMUSCULAR | Status: AC | PRN
  Administered 2015-08-21: 100 mL via INTRAVENOUS

## 2015-08-22 NOTE — Progress Notes (Signed)
I spoke with the patient about her lab results and she expressed understanding.  She will follow up with her physician.

## 2015-10-28 ENCOUNTER — Encounter: Payer: Self-pay | Admitting: Physician Assistant

## 2015-10-28 ENCOUNTER — Ambulatory Visit: Payer: Self-pay | Admitting: Physician Assistant

## 2015-10-28 VITALS — BP 120/70 | HR 82 | Temp 98.2°F

## 2015-10-28 DIAGNOSIS — N390 Urinary tract infection, site not specified: Secondary | ICD-10-CM

## 2015-10-28 LAB — POCT URINALYSIS DIPSTICK
Bilirubin, UA: NEGATIVE
Blood, UA: NEGATIVE
Glucose, UA: NEGATIVE
Ketones, UA: NEGATIVE
Nitrite, UA: NEGATIVE
PROTEIN UA: NEGATIVE
Spec Grav, UA: 1.02
UROBILINOGEN UA: 0.2
pH, UA: 6

## 2015-10-28 MED ORDER — CIPROFLOXACIN HCL 250 MG PO TABS: 250.0000 mg | ORAL_TABLET | Freq: Two times a day (BID) | ORAL | Status: DC

## 2015-10-28 NOTE — Progress Notes (Signed)
Here for uti, urgency burning, seen by CMA  O: vitals wnl, nad, ua 1+ leuks  A: uti  P: sent rx for cipro 250mg  bid x 7d to [facility]

## 2015-11-08 ENCOUNTER — Ambulatory Visit: Payer: Self-pay | Admitting: Physician Assistant

## 2015-12-19 ENCOUNTER — Ambulatory Visit: Payer: Self-pay | Admitting: Physician Assistant

## 2015-12-25 ENCOUNTER — Ambulatory Visit: Payer: Self-pay | Admitting: Physician Assistant

## 2016-02-06 ENCOUNTER — Other Ambulatory Visit: Payer: Self-pay | Admitting: Physician Assistant

## 2016-02-06 NOTE — Telephone Encounter (Signed)
Med refill approved x 1, need a tsh level on patient prior to additional refills

## 2016-03-03 ENCOUNTER — Other Ambulatory Visit: Payer: Self-pay

## 2016-03-09 ENCOUNTER — Other Ambulatory Visit: Payer: Self-pay

## 2016-03-09 ENCOUNTER — Other Ambulatory Visit: Payer: Self-pay | Admitting: Physician Assistant

## 2016-03-09 NOTE — Progress Notes (Signed)
Patient came in to have blood drawn for testing per Dr. Forde Dandy.  Patient had to go to the [facility] draw station to have her blood drawn due to one of the blood specimen having to be frozen.

## 2016-03-10 LAB — CMP12+LP+TP+TSH+6AC+CBC/D/PLT
A/G RATIO: 1.5 (ref 1.2–2.2)
ALBUMIN: 4.1 g/dL (ref 3.5–5.5)
ALT: 18 IU/L (ref 0–32)
AST: 16 IU/L (ref 0–40)
Alkaline Phosphatase: 51 IU/L (ref 39–117)
BILIRUBIN TOTAL: 0.3 mg/dL (ref 0.0–1.2)
BUN / CREAT RATIO: 16 (ref 9–23)
BUN: 14 mg/dL (ref 6–24)
Basophils Absolute: 0 10*3/uL (ref 0.0–0.2)
Basos: 0 %
CHOL/HDL RATIO: 3.1 ratio (ref 0.0–4.4)
CHOLESTEROL TOTAL: 246 mg/dL — AB (ref 100–199)
Calcium: 9.6 mg/dL (ref 8.7–10.2)
Chloride: 102 mmol/L (ref 96–106)
Creatinine, Ser: 0.88 mg/dL (ref 0.57–1.00)
EOS (ABSOLUTE): 0.1 10*3/uL (ref 0.0–0.4)
Eos: 2 %
Estimated CHD Risk: <0.5 times avg. (ref 0.0–1.0)
FREE THYROXINE INDEX: 2.4 (ref 1.2–4.9)
GFR calc Af Amer: 83 mL/min/{1.73_m2} (ref >59)
GFR, EST NON AFRICAN AMERICAN: 72 mL/min/{1.73_m2} (ref >59)
GGT: 12 IU/L (ref 0–60)
Globulin, Total: 2.7 g/dL (ref 1.5–4.5)
Glucose: 78 mg/dL (ref 65–99)
HDL: 79 mg/dL (ref >39)
Hematocrit: 39.8 % (ref 34.0–46.6)
Hemoglobin: 13.5 g/dL (ref 11.1–15.9)
IMMATURE GRANS (ABS): 0 10*3/uL (ref 0.0–0.1)
IRON: 80 ug/dL (ref 27–159)
Immature Granulocytes: 0 %
LDH: 197 IU/L (ref 119–226)
LDL Calculated: 139 mg/dL — ABNORMAL HIGH (ref 0–99)
LYMPHS: 42 %
Lymphocytes Absolute: 3.3 10*3/uL — ABNORMAL HIGH (ref 0.7–3.1)
MCH: 30.5 pg (ref 26.6–33.0)
MCHC: 33.9 g/dL (ref 31.5–35.7)
MCV: 90 fL (ref 79–97)
MONOS ABS: 0.9 10*3/uL (ref 0.1–0.9)
Monocytes: 11 %
NEUTROS ABS: 3.5 10*3/uL (ref 1.4–7.0)
Neutrophils: 45 %
Phosphorus: 3.6 mg/dL (ref 2.5–4.5)
Platelets: 319 10*3/uL (ref 150–379)
Potassium: 4 mmol/L (ref 3.5–5.2)
RBC: 4.43 x10E6/uL (ref 3.77–5.28)
RDW: 13.8 % (ref 12.3–15.4)
SODIUM: 141 mmol/L (ref 134–144)
T3 Uptake Ratio: 29 % (ref 24–39)
T4 TOTAL: 8.3 ug/dL (ref 4.5–12.0)
TOTAL PROTEIN: 6.8 g/dL (ref 6.0–8.5)
TRIGLYCERIDES: 141 mg/dL (ref 0–149)
TSH: 0.411 u[IU]/mL — AB (ref 0.450–4.500)
Uric Acid: 5.4 mg/dL (ref 2.5–7.1)
VLDL Cholesterol Cal: 28 mg/dL (ref 5–40)
WBC: 7.8 10*3/uL (ref 3.4–10.8)

## 2016-03-10 LAB — ACTH: ACTH: 375.3 pg/mL — ABNORMAL HIGH (ref 7.2–63.3)

## 2016-03-10 LAB — CORTISOL: Cortisol: 0.1 ug/dL

## 2016-03-15 ENCOUNTER — Other Ambulatory Visit: Payer: Self-pay | Admitting: Physician Assistant

## 2016-04-16 ENCOUNTER — Ambulatory Visit: Payer: Self-pay | Admitting: Dietician

## 2016-05-04 ENCOUNTER — Other Ambulatory Visit: Payer: Self-pay | Admitting: Physician Assistant

## 2016-05-04 NOTE — Telephone Encounter (Signed)
Med refill approved, pt needs to come to clinic to recheck tsh level as it was low on last bloodwork

## 2016-05-15 ENCOUNTER — Encounter: Payer: Self-pay | Admitting: Dietician

## 2016-05-15 ENCOUNTER — Ambulatory Visit: Payer: Self-pay | Admitting: Skilled Nursing Facility1

## 2016-05-15 ENCOUNTER — Encounter: Payer: Managed Care, Other (non HMO) | Attending: Endocrinology | Admitting: Dietician

## 2016-05-15 DIAGNOSIS — R635 Abnormal weight gain: Secondary | ICD-10-CM | POA: Insufficient documentation

## 2016-05-15 DIAGNOSIS — Z713 Dietary counseling and surveillance: Secondary | ICD-10-CM | POA: Diagnosis present

## 2016-05-15 DIAGNOSIS — E782 Mixed hyperlipidemia: Secondary | ICD-10-CM

## 2016-05-15 DIAGNOSIS — E781 Pure hyperglyceridemia: Secondary | ICD-10-CM | POA: Insufficient documentation

## 2016-05-15 DIAGNOSIS — E663 Overweight: Secondary | ICD-10-CM

## 2016-05-15 NOTE — Patient Instructions (Addendum)
Discuss your vitamin D supplementation with your MD. Continue to stay active. Consider increasing this to 1 hour most days. Rethink what you drink.  Consider Journaling your food intake, water intake, and exercise. Avoid skipping meals Make mindful choices, continue to eat slowly and stopping when you are satisfied. Aim for 2-3 Carb Choices per meal (30-45 grams) +/- 1 either way  Aim for 0-1 Carbs per snack if hungry  Include protein in moderation with your meals and snacks Be informed with choices eating out.  Choose baked or other low fat most often.
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
## 2016-06-10 ENCOUNTER — Other Ambulatory Visit: Payer: Self-pay | Admitting: Physician Assistant

## 2016-06-10 NOTE — Telephone Encounter (Signed)
Med refill approved, will need tsh level for next refill

## 2016-09-08 ENCOUNTER — Emergency Department
Admission: EM | Admit: 2016-09-08 | Discharge: 2016-09-08 | Disposition: A | Discharge status: Home / Self Care | Payer: Worker's Compensation | Attending: Emergency Medicine | Admitting: Emergency Medicine

## 2016-09-08 ENCOUNTER — Emergency Department: Payer: Worker's Compensation

## 2016-09-08 ENCOUNTER — Encounter: Payer: Self-pay | Admitting: Emergency Medicine

## 2016-09-08 DIAGNOSIS — Z85528 Personal history of other malignant neoplasm of kidney: Secondary | ICD-10-CM | POA: Insufficient documentation

## 2016-09-08 DIAGNOSIS — Y9389 Activity, other specified: Secondary | ICD-10-CM | POA: Insufficient documentation

## 2016-09-08 DIAGNOSIS — S0083XA Contusion of other part of head, initial encounter: Secondary | ICD-10-CM | POA: Diagnosis not present

## 2016-09-08 DIAGNOSIS — Y990 Civilian activity done for income or pay: Secondary | ICD-10-CM | POA: Diagnosis not present

## 2016-09-08 DIAGNOSIS — E039 Hypothyroidism, unspecified: Secondary | ICD-10-CM | POA: Insufficient documentation

## 2016-09-08 DIAGNOSIS — Y929 Unspecified place or not applicable: Secondary | ICD-10-CM | POA: Insufficient documentation

## 2016-09-08 DIAGNOSIS — S0990XA Unspecified injury of head, initial encounter: Secondary | ICD-10-CM | POA: Diagnosis present

## 2016-09-08 DIAGNOSIS — S0093XA Contusion of unspecified part of head, initial encounter: Secondary | ICD-10-CM

## 2016-09-08 DIAGNOSIS — Z79899 Other long term (current) drug therapy: Secondary | ICD-10-CM | POA: Insufficient documentation
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
## 2016-09-12 ENCOUNTER — Encounter: Payer: Self-pay | Admitting: Emergency Medicine

## 2016-11-01 ENCOUNTER — Other Ambulatory Visit: Payer: Self-pay | Admitting: Physician Assistant

## 2016-11-02 NOTE — Telephone Encounter (Signed)
Med refill for synthroid approved, pt is to come to office prior to additional refills for eval and labs unless she has been seen by her specialist

## 2016-12-23 ENCOUNTER — Other Ambulatory Visit: Payer: Self-pay

## 2016-12-23 VITALS — BP 100/70 | Ht 66.0 in | Wt 178.0 lb

## 2016-12-23 DIAGNOSIS — Z008 Encounter for other general examination: Secondary | ICD-10-CM

## 2016-12-23 DIAGNOSIS — Z0000 Encounter for general adult medical examination without abnormal findings: Secondary | ICD-10-CM

## 2016-12-23 DIAGNOSIS — Z713 Dietary counseling and surveillance: Secondary | ICD-10-CM

## 2016-12-23 DIAGNOSIS — Z0189 Encounter for other specified special examinations: Principal | ICD-10-CM

## 2016-12-23 NOTE — Progress Notes (Signed)
Pt here for biometrics and labs, had regular physical with her physician

## 2016-12-24 LAB — CMP12+LP+TP+TSH+6AC+CBC/D/PLT
ALT: 16 IU/L (ref 0–32)
AST: 16 IU/L (ref 0–40)
Albumin/Globulin Ratio: 1.5 (ref 1.2–2.2)
Albumin: 4.3 g/dL (ref 3.6–4.8)
Alkaline Phosphatase: 58 IU/L (ref 39–117)
BASOS ABS: 0 10*3/uL (ref 0.0–0.2)
BILIRUBIN TOTAL: 0.5 mg/dL (ref 0.0–1.2)
BUN/Creatinine Ratio: 12 (ref 12–28)
BUN: 13 mg/dL (ref 8–27)
Basos: 0 %
CALCIUM: 9.8 mg/dL (ref 8.7–10.3)
CHLORIDE: 100 mmol/L (ref 96–106)
CHOL/HDL RATIO: 3.1 ratio (ref 0.0–4.4)
Cholesterol, Total: 257 mg/dL — ABNORMAL HIGH (ref 100–199)
Creatinine, Ser: 1.05 mg/dL — ABNORMAL HIGH (ref 0.57–1.00)
EOS (ABSOLUTE): 0.1 10*3/uL (ref 0.0–0.4)
Eos: 1 %
FREE THYROXINE INDEX: 3.4 (ref 1.2–4.9)
GFR calc Af Amer: 67 mL/min/{1.73_m2} (ref >59)
GFR calc non Af Amer: 58 mL/min/{1.73_m2} — ABNORMAL LOW (ref >59)
GGT: 13 IU/L (ref 0–60)
GLUCOSE: 75 mg/dL (ref 65–99)
Globulin, Total: 2.8 g/dL (ref 1.5–4.5)
HDL: 83 mg/dL (ref >39)
Hematocrit: 41.9 % (ref 34.0–46.6)
Hemoglobin: 13.5 g/dL (ref 11.1–15.9)
Immature Grans (Abs): 0 10*3/uL (ref 0.0–0.1)
Immature Granulocytes: 0 %
Iron: 113 ug/dL (ref 27–159)
LDH: 243 IU/L — AB (ref 119–226)
LDL Calculated: 145 mg/dL — ABNORMAL HIGH (ref 0–99)
Lymphocytes Absolute: 2.5 10*3/uL (ref 0.7–3.1)
Lymphs: 23 %
MCH: 29.4 pg (ref 26.6–33.0)
MCHC: 32.2 g/dL (ref 31.5–35.7)
MCV: 91 fL (ref 79–97)
MONOS ABS: 0.9 10*3/uL (ref 0.1–0.9)
Monocytes: 8 %
NEUTROS ABS: 7.1 10*3/uL — AB (ref 1.4–7.0)
NEUTROS PCT: 68 %
PLATELETS: 339 10*3/uL (ref 150–379)
POTASSIUM: 4.1 mmol/L (ref 3.5–5.2)
Phosphorus: 3.2 mg/dL (ref 2.5–4.5)
RBC: 4.59 x10E6/uL (ref 3.77–5.28)
RDW: 13.8 % (ref 12.3–15.4)
Sodium: 142 mmol/L (ref 134–144)
T3 Uptake Ratio: 33 % (ref 24–39)
T4, Total: 10.3 ug/dL (ref 4.5–12.0)
TSH: 0.075 u[IU]/mL — AB (ref 0.450–4.500)
Total Protein: 7.1 g/dL (ref 6.0–8.5)
Triglycerides: 144 mg/dL (ref 0–149)
Uric Acid: 5.1 mg/dL (ref 2.5–7.1)
VLDL CHOLESTEROL CAL: 29 mg/dL (ref 5–40)
WBC: 10.5 10*3/uL (ref 3.4–10.8)

## 2016-12-24 LAB — CORTISOL, ACTH STIMULATION: Cortisol Baseline: 26 ug/dL

## 2016-12-24 LAB — PARATHYROID HORMONE, INTACT (NO CA): PTH: 26 pg/mL (ref 15–65)

## 2016-12-31 ENCOUNTER — Ambulatory Visit: Payer: Self-pay | Admitting: Physician Assistant

## 2016-12-31 NOTE — Progress Notes (Signed)
I spoke with the patient about her lab results and she expressed understanding.  Patient will follow up with her physician, D, [place] at [facility].  Results were faxed and received.

## 2017-01-13 ENCOUNTER — Other Ambulatory Visit: Payer: Self-pay

## 2017-01-14 ENCOUNTER — Ambulatory Visit: Payer: Self-pay | Admitting: Physician Assistant

## 2017-04-19 ENCOUNTER — Other Ambulatory Visit: Payer: Self-pay | Admitting: Physician Assistant

## 2017-04-19 NOTE — Telephone Encounter (Signed)
Med refill for synthroid approved

## 2017-04-21 ENCOUNTER — Ambulatory Visit: Payer: Self-pay | Admitting: Physician Assistant

## 2017-04-22 ENCOUNTER — Ambulatory Visit: Payer: Self-pay | Admitting: Physician Assistant

## 2017-04-22 VITALS — BP 126/72 | HR 86 | Temp 98.5°F | Resp 16

## 2017-04-22 DIAGNOSIS — N390 Urinary tract infection, site not specified: Secondary | ICD-10-CM

## 2017-04-22 LAB — POCT URINALYSIS DIPSTICK
Bilirubin, UA: NEGATIVE
GLUCOSE UA: NEGATIVE
Ketones, UA: NEGATIVE
NITRITE UA: NEGATIVE
Protein, UA: NEGATIVE
RBC UA: NEGATIVE
Spec Grav, UA: 1.02 (ref 1.010–1.025)
Urobilinogen, UA: 0.2 E.U./dL
pH, UA: 5.5 (ref 5.0–8.0)

## 2017-04-22 MED ORDER — CIPROFLOXACIN HCL 250 MG PO TABS: 250.0000 mg | ORAL_TABLET | Freq: Two times a day (BID) | ORAL | 0 refills | Status: DC

## 2017-04-22 NOTE — Progress Notes (Signed)
S:  C/o uti sx for few days, burning, urgency, frequency, denies vaginal discharge, abdominal pain or flank pain:  Remainder ros neg  O:  Vitals wnl, nad, no cva tenderness, back nontender, lungs c t a,cv rrr, abd soft nontender, bs normal, n/v intact  A: uti  P: cipro 250mg  bid x 7d, increase water intake, add cranberry juice, return if not improving in 2 -3 days, return earlier if worsening, discussed pyelonephritis sx, urine culture

## 2017-04-24 LAB — URINE CULTURE: Organism ID, Bacteria: NO GROWTH

## 2017-05-14 ENCOUNTER — Other Ambulatory Visit: Payer: Self-pay | Admitting: Obstetrics and Gynecology

## 2017-05-14 ENCOUNTER — Ambulatory Visit (INDEPENDENT_AMBULATORY_CARE_PROVIDER_SITE_OTHER): Payer: BC Managed Care – PPO | Admitting: Obstetrics and Gynecology

## 2017-05-14 ENCOUNTER — Encounter: Payer: Self-pay | Admitting: Obstetrics and Gynecology

## 2017-05-14 VITALS — BP 112/67 | HR 82 | Ht 66.0 in | Wt 169.3 lb

## 2017-05-14 DIAGNOSIS — E559 Vitamin D deficiency, unspecified: Secondary | ICD-10-CM | POA: Diagnosis not present

## 2017-05-14 DIAGNOSIS — Z01419 Encounter for gynecological examination (general) (routine) without abnormal findings: Secondary | ICD-10-CM

## 2017-05-14 MED ORDER — PHENTERMINE HCL 37.5 MG PO TABS: 37.5000 mg | ORAL_TABLET | Freq: Every day | ORAL | 2 refills | Status: DC

## 2017-05-14 MED ORDER — CYANOCOBALAMIN 1000 MCG/ML IJ SOLN: 1000.0000 ug | INTRAMUSCULAR | 1 refills | Status: DC

## 2017-05-14 NOTE — Patient Instructions (Signed)
Thank you for enrolling in [place]. Please follow the instructions below to securely access your online medical record. MyChart allows you to send messages to your doctor, view your test results, renew your prescriptions, schedule appointments, and more.  How Do I Sign Up? 1. In your Internet browser, go to [email or web address]. 2. Click on the New  User? link in the Sign In box.  3. Enter your MyChart Access Code exactly as it appears below. You will not need to use this code after you have completed the sign-up process. If you do not sign up before the expiration date, you must request a new code. MyChart Access Code: [email or web address] Expires: 06/28/2017 11:33 AM  4. Enter the last four digits of your Social Security Number (xxxx) and Date of Birth (mm/dd/yyyy) as indicated and click Next. You will be taken to the next sign-up page. 5. Create a MyChart ID. This will be your MyChart login ID and cannot be changed, so think of one that is secure and easy to remember. 6. Create a MyChart password. You can change your password at any time. 7. Enter your Password Reset Question and Answer and click Next. This can be used at a later time if you forget your password.  8. Select your communication preference, and if applicable enter your e-mail address. You will receive e-mail notification when new information is available in MyChart by choosing to receive e-mail notifications and filling in your e-mail. 9. Click Sign In. You can now view your medical record.   Additional Information If you have questions, you can email [email or web address] or call [phone] to talk to our [facility] staff. Remember, MyChart is NOT to be used for urgent needs. For medical emergencies, dial 911.

## 2017-05-14 NOTE — Progress Notes (Signed)
Subjective:   Caitlin Allison is a 61 y.o. G33P2 Caucasian female here for a routine well-woman exam.  No LMP recorded. Patient is postmenopausal.    Current complaints: can't lose weight, is on steroids for the addison's disease. Gain 6-8 pounds in the last few months. PCP: [place]       does desire labs  Social History: Sexual: heterosexual Marital Status: married Living situation: with family Occupation: Garment/textile technologist Tobacco/alcohol: no tobacco use Illicit drugs: no history of illicit drug use  The following portions of the patient's history were reviewed and updated as appropriate: allergies, current medications, past family history, past medical history, past social history, past surgical history and problem list.  Past Medical History Past Medical History:  Diagnosis Date  . Addison disease ([place])   . Addison disease ([place])   . Blood transfusion 1982  . Cancer ([place])    Hx of kidney cancer  . Hyperlipidemia   . Thyroid disease    hypo    Past Surgical History Past Surgical History:  Procedure Laterality Date  . KIDNEY SURGERY    . NEPHRECTOMY  2009   right  . TUBAL LIGATION  1984    Gynecologic History G2P2  No LMP recorded. Patient is postmenopausal. Contraception: post menopausal status Last Pap: ?Marland Kitchen Results were: normal Last mammogram: ?. Results were: normal Hasn't had a BDS  Obstetric History OB History  Gravida Para Term Preterm AB Living  2 2          SAB TAB Ectopic Multiple Live Births               # Outcome Date GA Lbr Len/2nd Weight Sex Delivery Anes PTL Lv  2 Para 1984    F Vag-Spont     1 Para 1982    M Vag-Spont         Current Medications Current Outpatient Prescriptions on File Prior to Visit  Medication Sig Dispense Refill  . hydrocortisone (CORTEF) 20 MG tablet Take 20 mg by mouth 2 (two) times daily.     . Multiple Vitamins-Minerals (MULTIVITAMIN WITH MINERALS) tablet Take 1 tablet by mouth daily.    Marland Kitchen SYNTHROID 150 MCG tablet TAKE 1  TABLET (150 MCG TOTAL) BY MOUTH DAILY BEFORE BREAKFAST. 30 tablet 3  . acidophilus (RISAQUAD) CAPS capsule Take by mouth daily.    . ciprofloxacin (CIPRO) 250 MG tablet Take 1 tablet (250 mg total) by mouth 2 (two) times daily. (Patient not taking: Reported on 05/14/2017) 14 tablet 0  . RESTASIS 0.05 % ophthalmic emulsion INSTILL ONE DROP IN EACH EYE TWICE DAILY.  5   No current facility-administered medications on file prior to visit.     Review of Systems Patient denies any headaches, blurred vision, shortness of breath, chest pain, abdominal pain, problems with bowel movements, urination, or intercourse.  Objective:  BP 112/67   Pulse 82   Ht 5\' 6"  (1.676 m)   Wt 169 lb 4.8 oz (76.8 kg)   BMI 27.33 kg/m  Physical Exam  General:  Well developed, well nourished, no acute distress. She is alert and oriented x3. Skin:  Warm and dry Neck:  Midline trachea, no thyromegaly or nodules Cardiovascular: Regular rate and rhythm, no murmur heard Lungs:  Effort normal, all lung fields clear to auscultation bilaterally Breasts:  No dominant palpable mass, retraction, or nipple discharge Abdomen:  Soft, non tender, no hepatosplenomegaly or masses Pelvic:  External genitalia is normal in appearance.  The vagina is normal in appearance.  The cervix is bulbous, no CMT.  Thin prep pap is done with HR HPV cotesting. Uterus is felt to be normal size, shape, and contour.  No adnexal masses or tenderness noted. Extremities:  No swelling or varicosities noted Psych:  She has a normal mood and affect  Assessment:   Healthy well-woman exam Addison disease Overweight H/o kidney cancer increased appetite secondary to steroid use.  Plan:  Labs obtained- will follow up accordingly F/U 1 year for Ae, or sooner if needed Mammogram ordered BDS ordered appetite suppressant trial RTC 1 month for med check and wt check. Clarance Bollard Rockney Ghee, CNM

## 2017-05-15 LAB — COMPREHENSIVE METABOLIC PANEL
ALK PHOS: 54 IU/L (ref 39–117)
ALT: 11 IU/L (ref 0–32)
AST: 12 IU/L (ref 0–40)
Albumin/Globulin Ratio: 1.5 (ref 1.2–2.2)
Albumin: 4.2 g/dL (ref 3.6–4.8)
BUN/Creatinine Ratio: 10 — ABNORMAL LOW (ref 12–28)
BUN: 11 mg/dL (ref 8–27)
Bilirubin Total: 0.3 mg/dL (ref 0.0–1.2)
CHLORIDE: 103 mmol/L (ref 96–106)
CO2: 27 mmol/L (ref 20–29)
Calcium: 9.6 mg/dL (ref 8.7–10.3)
Creatinine, Ser: 1.07 mg/dL — ABNORMAL HIGH (ref 0.57–1.00)
GFR calc Af Amer: 65 mL/min/{1.73_m2} (ref >59)
GFR calc non Af Amer: 57 mL/min/{1.73_m2} — ABNORMAL LOW (ref >59)
GLOBULIN, TOTAL: 2.8 g/dL (ref 1.5–4.5)
Glucose: 83 mg/dL (ref 65–99)
POTASSIUM: 4.1 mmol/L (ref 3.5–5.2)
SODIUM: 143 mmol/L (ref 134–144)
Total Protein: 7 g/dL (ref 6.0–8.5)

## 2017-05-15 LAB — VITAMIN D 25 HYDROXY (VIT D DEFICIENCY, FRACTURES): VIT D 25 HYDROXY: 27.8 ng/mL — AB (ref 30.0–100.0)

## 2017-05-15 LAB — THYROID PANEL WITH TSH
Free Thyroxine Index: 3.2 (ref 1.2–4.9)
T3 Uptake Ratio: 31 % (ref 24–39)
T4 TOTAL: 10.4 ug/dL (ref 4.5–12.0)
TSH: 1.45 u[IU]/mL (ref 0.450–4.500)

## 2017-05-17 LAB — CYTOLOGY - PAP

## 2017-05-18 ENCOUNTER — Other Ambulatory Visit: Payer: Self-pay | Admitting: Obstetrics and Gynecology

## 2017-05-18 DIAGNOSIS — E559 Vitamin D deficiency, unspecified: Secondary | ICD-10-CM | POA: Insufficient documentation

## 2017-05-18 MED ORDER — VITAMIN D (ERGOCALCIFEROL) 1.25 MG (50000 UNIT) PO CAPS: 50000.0000 [IU] | ORAL_CAPSULE | ORAL | 1 refills | Status: AC

## 2017-06-11 ENCOUNTER — Encounter: Payer: Managed Care, Other (non HMO) | Admitting: Obstetrics and Gynecology

## 2017-06-16 ENCOUNTER — Ambulatory Visit: Payer: Self-pay | Admitting: Physician Assistant

## 2017-08-19 ENCOUNTER — Other Ambulatory Visit: Payer: Self-pay

## 2017-09-16 ENCOUNTER — Ambulatory Visit: Payer: Self-pay | Admitting: Medical

## 2017-09-16 VITALS — BP 110/70 | HR 82 | Temp 98.0°F | Resp 18 | Ht 66.0 in | Wt 169.0 lb

## 2017-09-16 DIAGNOSIS — Z299 Encounter for prophylactic measures, unspecified: Secondary | ICD-10-CM

## 2017-09-16 NOTE — Patient Instructions (Signed)
Sciatica Sciatica is pain, numbness, weakness, or tingling along your sciatic nerve. The sciatic nerve starts in the lower back and goes down the back of each leg. Sciatica happens when this nerve is pinched or has pressure put on it. Sciatica usually goes away on its own or with treatment. Sometimes, sciatica may keep coming back (recur). Follow these instructions at home: Medicines  Take over-the-counter and prescription medicines only as told by your doctor.  Do not drive or use heavy machinery while taking prescription pain medicine. Managing pain  If directed, put ice on the affected area. ? Put ice in a plastic bag. ? Place a towel between your skin and the bag. ? Leave the ice on for 20 minutes, 2-3 times a day.  After icing, apply heat to the affected area before you exercise or as often as told by your doctor. Use the heat source that your doctor tells you to use, such as a moist heat pack or a heating pad. ? Place a towel between your skin and the heat source. ? Leave the heat on for 20-30 minutes. ? Remove the heat if your skin turns bright red. This is especially important if you are unable to feel pain, heat, or cold. You may have a greater risk of getting burned. Activity  Return to your normal activities as told by your doctor. Ask your doctor what activities are safe for you. ? Avoid activities that make your sciatica worse.  Take short rests during the day. Rest in a lying or standing position. This is usually better than sitting to rest. ? When you rest for a long time, do some physical activity or stretching between periods of rest. ? Avoid sitting for a long time without moving. Get up and move around at least one time each hour.  Exercise and stretch regularly, as told by your doctor.  Do not lift anything that is heavier than 10 lb (4.5 kg) while you have symptoms of sciatica. ? Avoid lifting heavy things even when you do not have symptoms. ? Avoid lifting heavy  things over and over.  When you lift objects, always lift in a way that is safe for your body. To do this, you should: ? Bend your knees. ? Keep the object close to your body. ? Avoid twisting. General instructions  Use good posture. ? Avoid leaning forward when you are sitting. ? Avoid hunching over when you are standing.  Stay at a healthy weight.  Wear comfortable shoes that support your feet. Avoid wearing high heels.  Avoid sleeping on a mattress that is too soft or too hard. You might have less pain if you sleep on a mattress that is firm enough to support your back.  Keep all follow-up visits as told by your doctor. This is important. Contact a doctor if:  You have pain that: ? Wakes you up when you are sleeping. ? Gets worse when you lie down. ? Is worse than the pain you have had in the past. ? Lasts longer than 4 weeks.  You lose weight for without trying. Get help right away if:  You cannot control when you pee (urinate) or poop (have a bowel movement).  You have weakness in any of these areas and it gets worse. ? Lower back. ? Lower belly (pelvis). ? Butt (buttocks). ? Legs.  You have redness or swelling of your back.  You have a burning feeling when you pee. This information is not intended to replace   advice given to you by your health care provider. Make sure you discuss any questions you have with your health care provider. Document Released: 05/05/2008 Document Revised: 01/02/2016 Document Reviewed: 04/05/2015 Elsevier Interactive Patient Education  2018 Elsevier Inc.  

## 2017-09-16 NOTE — Progress Notes (Signed)
   Subjective:    Patient ID: Caitlin Allison, female    DOB: 1956/07/28, 62 y.o.   MRN: [id number]  HPI 62 yo female in non acute distress. 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  Here for annual physical and Biometric screening.  Review of Systems  Constitutional: Negative for chills and fever.  HENT: Negative for congestion, ear pain, sinus pressure, sinus pain and sore throat.   Respiratory: Negative for cough and shortness of breath.   Cardiovascular: Negative for chest pain.  Gastrointestinal: Negative for abdominal pain.  Musculoskeletal: Negative for myalgias.  Skin: Negative for rash.  Neurological: Negative for dizziness, syncope and light-headedness.  Hematological: Negative for adenopathy.  Psychiatric/Behavioral: Negative for behavioral problems, self-injury and suicidal ideas. The patient is not nervous/anxious.        Objective:   Physical Exam  Constitutional: She is oriented to person, place, and time. She appears well-developed and well-nourished.  HENT:  Head: Normocephalic and atraumatic.  Right Ear: Hearing, tympanic membrane, external ear and ear canal normal.  Left Ear: Hearing, tympanic membrane, external ear and ear canal normal.  Nose: Nose normal.  Mouth/Throat: Uvula is midline, oropharynx is clear and moist and mucous membranes are normal.  Eyes: Conjunctivae are normal. Pupils are equal, round, and reactive to light.  Neck: Normal range of motion. Neck supple. No thyromegaly present.  Cardiovascular: Normal rate, regular rhythm and normal heart sounds.  Pulses:      Carotid pulses are 2+ on the right side, and 2+ on the left side.      Radial pulses are 2+ on the right side, and 2+ on the left side.       Posterior tibial pulses are 2+ on the right side, and 2+ on the left side.  Pulmonary/Chest: Effort normal and breath sounds normal.  Abdominal: Soft. Bowel sounds are normal.  Musculoskeletal: Normal range of motion.  Lymphadenopathy:     She has no cervical adenopathy.  Neurological: She is alert and oriented to person, place, and time. She has normal strength. No cranial nerve deficit or sensory deficit. She displays a negative Romberg sign. GCS eye subscore is 4. GCS verbal subscore is 5. GCS motor subscore is 6.  Reflex Scores:      Patellar reflexes are 2+ on the right side and 2+ on the left side.      Achilles reflexes are 2+ on the right side and 2+ on the left side. Skin: Skin is warm, dry and intact.  Psychiatric: She has a normal mood and affect. Her speech is normal and behavior is normal. Judgment and thought content normal. Cognition and memory are normal.  Nursing note and vitals reviewed.  Finger to nose  wnl, heel to toe wnl  Pap done  2018 done by Dr. Gayla Medicus though she needs a mammogram. Her primary is Dr. Roque Cash.      Assessment & Plan:  Physical exam/ Biometric screening History of sciatica on the left given information about it. None now. Needing Mammogram card given to patient. Return to the clinic as needed. Patient verbalizes understanding and has no questions at discharge. Given Mammogram information, order placed in Epic by [facility].

## 2017-09-17 LAB — CMP12+LP+TP+TSH+6AC+CBC/D/PLT
A/G RATIO: 1.7 (ref 1.2–2.2)
ALK PHOS: 52 IU/L (ref 39–117)
ALT: 19 IU/L (ref 0–32)
AST: 21 IU/L (ref 0–40)
Albumin: 4.8 g/dL (ref 3.6–4.8)
BASOS: 0 %
BUN / CREAT RATIO: 11 — AB (ref 12–28)
BUN: 12 mg/dL (ref 8–27)
Basophils Absolute: 0 10*3/uL (ref 0.0–0.2)
Bilirubin Total: 0.6 mg/dL (ref 0.0–1.2)
CALCIUM: 10 mg/dL (ref 8.7–10.3)
CHOL/HDL RATIO: 3.4 ratio (ref 0.0–4.4)
Chloride: 100 mmol/L (ref 96–106)
Cholesterol, Total: 289 mg/dL — ABNORMAL HIGH (ref 100–199)
Creatinine, Ser: 1.1 mg/dL — ABNORMAL HIGH (ref 0.57–1.00)
EOS (ABSOLUTE): 0.1 10*3/uL (ref 0.0–0.4)
EOS: 1 %
Estimated CHD Risk: 0.5 times avg. (ref 0.0–1.0)
Free Thyroxine Index: 2.2 (ref 1.2–4.9)
GFR calc non Af Amer: 55 mL/min/{1.73_m2} — ABNORMAL LOW (ref >59)
GFR, EST AFRICAN AMERICAN: 63 mL/min/{1.73_m2} (ref >59)
GGT: 15 IU/L (ref 0–60)
GLUCOSE: 85 mg/dL (ref 65–99)
Globulin, Total: 2.9 g/dL (ref 1.5–4.5)
HDL: 85 mg/dL (ref >39)
HEMATOCRIT: 45.1 % (ref 34.0–46.6)
HEMOGLOBIN: 15.2 g/dL (ref 11.1–15.9)
IMMATURE GRANS (ABS): 0 10*3/uL (ref 0.0–0.1)
Immature Granulocytes: 0 %
Iron: 131 ug/dL (ref 27–159)
LDH: 238 IU/L — AB (ref 119–226)
LDL CALC: 185 mg/dL — AB (ref 0–99)
LYMPHS: 35 %
Lymphocytes Absolute: 3.7 10*3/uL — ABNORMAL HIGH (ref 0.7–3.1)
MCH: 31.1 pg (ref 26.6–33.0)
MCHC: 33.7 g/dL (ref 31.5–35.7)
MCV: 92 fL (ref 79–97)
MONOCYTES: 9 %
Monocytes Absolute: 1 10*3/uL — ABNORMAL HIGH (ref 0.1–0.9)
Neutrophils Absolute: 5.8 10*3/uL (ref 1.4–7.0)
Neutrophils: 55 %
PHOSPHORUS: 3.6 mg/dL (ref 2.5–4.5)
Platelets: 363 10*3/uL (ref 150–379)
Potassium: 4.5 mmol/L (ref 3.5–5.2)
RBC: 4.89 x10E6/uL (ref 3.77–5.28)
RDW: 13.9 % (ref 12.3–15.4)
SODIUM: 141 mmol/L (ref 134–144)
T3 Uptake Ratio: 26 % (ref 24–39)
T4 TOTAL: 8.4 ug/dL (ref 4.5–12.0)
TSH: 4.75 u[IU]/mL — ABNORMAL HIGH (ref 0.450–4.500)
Total Protein: 7.7 g/dL (ref 6.0–8.5)
Triglycerides: 96 mg/dL (ref 0–149)
URIC ACID: 5.5 mg/dL (ref 2.5–7.1)
VLDL Cholesterol Cal: 19 mg/dL (ref 5–40)
WBC: 10.7 10*3/uL (ref 3.4–10.8)

## 2017-09-17 LAB — VITAMIN D 25 HYDROXY (VIT D DEFICIENCY, FRACTURES): VIT D 25 HYDROXY: 57.7 ng/mL (ref 30.0–100.0)

## 2017-09-17 LAB — CORTISOL: CORTISOL: 1 ug/dL

## 2017-09-20 NOTE — Progress Notes (Signed)
Attached are recent labs for your patient.  She has requested that I send you a copy for a follow up appointment with you.  She is going to call your office to schedule an appointment.

## 2017-09-20 NOTE — Progress Notes (Signed)
Are these labs ordered by the patients doctor:?

## 2017-11-10 ENCOUNTER — Encounter: Payer: Managed Care, Other (non HMO) | Admitting: Obstetrics and Gynecology

## 2018-02-09 ENCOUNTER — Ambulatory Visit: Payer: Self-pay | Admitting: Family Medicine

## 2018-02-09 VITALS — BP 119/61 | HR 92 | Temp 98.5°F | Resp 16

## 2018-02-09 DIAGNOSIS — R109 Unspecified abdominal pain: Secondary | ICD-10-CM

## 2018-02-09 DIAGNOSIS — K219 Gastro-esophageal reflux disease without esophagitis: Secondary | ICD-10-CM

## 2018-02-09 LAB — POCT URINALYSIS DIPSTICK
Bilirubin, UA: NEGATIVE
Blood, UA: NEGATIVE
GLUCOSE UA: NEGATIVE
Ketones, UA: NEGATIVE
NITRITE UA: NEGATIVE
PROTEIN UA: NEGATIVE
SPEC GRAV UA: 1.02 (ref 1.010–1.025)
Urobilinogen, UA: 0.2 E.U./dL
pH, UA: 5.5 (ref 5.0–8.0)

## 2018-02-09 MED ORDER — OMEPRAZOLE 20 MG PO CPDR: 20.0000 mg | DELAYED_RELEASE_CAPSULE | Freq: Every day | ORAL | 0 refills | Status: AC

## 2018-02-09 NOTE — Progress Notes (Signed)
Subjective: Heartburn     Caitlin Allison is an 62 y.o. female who presents for evaluation of heartburn. This has been associated with abdominal bloating, belching, heartburn, midespigastric pain, nausea, nocturnal burning, regurgitation of undigested food and symptoms primarily relate to meals, and lying down after meals. She denies bilious reflux, chest pain, choking on food, cough, difficulty swallowing, dysphagia, hematemesis, hoarseness, laryngitis, melena, vomiting, diarrhea, cardiac symptoms, need to clear throat frequently, odynophagia, shortness of breath, unexpected weight loss, or wheezing. She denies melena, hematochezia, hematemesis, and coffee ground emesis.  Patient denies any urinary symptoms, fever, chills, flank pain, lower abdominal pain, suprapubic pressure, headache, or fatigue/malaise.  Patient reports 1-2 regular bowel movements a day that have been unchanged.  Denies regular use of NSAIDs or alcohol.  Denies any relevant GI history. Patient reports that the epigastric burning radiates to her neck and mouth when she belches.  Patient reports symptoms began approximately 1 year ago, when her schedule changed and she began going to lying down to go to bed 30 minutes after eating a large meal at dinnertime. Symptoms have remained stable. Patient has been taking her husband's omeprazole for the last few weeks, which completely resolves her symptoms.  Patient presents today solely to obtain a prescription for herself.  Patient reports tolerating this well without any side/adverse effects.  Additionally exacerbating factors include caffeine, carbonation, spicy foods, and greasy foods.  Patient reports sitting up improves her symptoms.  Patient is currently asymptomatic due to taking her husband's omeprazole this morning.  Patient regularly sees endocrinology for Addison's disease, has upcoming appointment scheduled.  Review of Systems Pertinent items noted in HPI and remainder of  comprehensive ROS otherwise negative.   Objective:    General appearance: alert, cooperative, appears stated age and no distress Throat: normal findings: buccal mucosa normal, tongue midline and normal, soft palate, uvula, and tonsils normal and oropharynx pink & moist without lesions or evidence of thrush Neck: no adenopathy and supple Lungs: clear to auscultation bilaterally Heart: regular rate and rhythm, S1, S2 normal, no murmur, click, rub or gallop Abdomen: soft, non-tender; bowel sounds normal; no masses,  no organomegaly.  Patient endorses slight "fullness" to epigastric region with deep palpation. Neurologic: Grossly normal   Assessment:    Gastroesophageal Reflux Disease   Plan:   Nonpharmacologic treatments were discussed including: eating smaller meals, elevation of the head of bed at night, avoidance of caffeine, chocolate, nicotine and peppermint, and avoiding tight fitting clothing.  Discussed not laying flat after eating.  Advised patient to keep a food diary. Will start a trial of proton pump inhibitors.  Provided patient with verbal instructions regarding use of omeprazole and side/adverse effects. Patient does not currently have a primary care provider.  Provided patient with a list of local resources and encouraged her to call today to establish care with a new primary care provider.  Informed patient that she needs to follow-up with her primary care provider within the next 4 weeks. Red flag symptoms and indications to seek medical care discussed. Urine culture pending.  Urine dipstick performed upon arrival due to patient's complaint of abdominal pain.  Positive for trace leukocytes.  Patient does not endorse any symptoms consistent with a urinary tract infection.  Patient does have a history of kidney cancer in the remote past and only one remaining kidney with recurrent urinary tract infections and is concerned about the possibility of an early infection and is  requesting a culture.  Discussed asymptomatic bacteriuria and urine  sample contamination.

## 2018-02-11 LAB — URINE CULTURE

## 2018-02-11 NOTE — Progress Notes (Signed)
Caitlin Allison, Will you please call Ms. Linskey and inform her that her urine culture was negative for infection?

## 2018-03-16 ENCOUNTER — Telehealth: Payer: Self-pay

## 2018-03-16 NOTE — Telephone Encounter (Signed)
Received fax requesting refill for Levothyroxine 150 MCG Tab  Last OV- 02/09/18 Last TSH lab- 09/16/17 (elevated on that date- previously low)

## 2018-03-16 NOTE — Telephone Encounter (Signed)
Pharmacy notified.

## 2018-03-16 NOTE — Telephone Encounter (Signed)
Please advise the pharmacy to contact the patient's primary care provider regarding refills.

## 2019-05-19 DIAGNOSIS — E039 Hypothyroidism, unspecified: Secondary | ICD-10-CM | POA: Diagnosis not present

## 2019-05-19 DIAGNOSIS — C649 Malignant neoplasm of unspecified kidney, except renal pelvis: Secondary | ICD-10-CM | POA: Diagnosis not present

## 2019-05-19 DIAGNOSIS — E271 Primary adrenocortical insufficiency: Secondary | ICD-10-CM | POA: Diagnosis not present

## 2019-05-19 DIAGNOSIS — E559 Vitamin D deficiency, unspecified: Secondary | ICD-10-CM | POA: Diagnosis not present

## 2019-06-01 DIAGNOSIS — E271 Primary adrenocortical insufficiency: Secondary | ICD-10-CM | POA: Diagnosis not present

## 2019-06-01 DIAGNOSIS — E559 Vitamin D deficiency, unspecified: Secondary | ICD-10-CM | POA: Diagnosis not present

## 2019-06-01 DIAGNOSIS — Z23 Encounter for immunization: Secondary | ICD-10-CM | POA: Diagnosis not present

## 2019-06-01 DIAGNOSIS — E038 Other specified hypothyroidism: Secondary | ICD-10-CM | POA: Diagnosis not present

## 2019-06-01 DIAGNOSIS — E7849 Other hyperlipidemia: Secondary | ICD-10-CM | POA: Diagnosis not present

## 2019-06-01 DIAGNOSIS — E310 Autoimmune polyglandular failure: Secondary | ICD-10-CM | POA: Diagnosis not present

## 2019-06-01 DIAGNOSIS — Z0189 Encounter for other specified special examinations: Secondary | ICD-10-CM | POA: Diagnosis not present

## 2019-08-24 ENCOUNTER — Encounter: Payer: Self-pay | Admitting: Obstetrics and Gynecology

## 2019-09-13 DIAGNOSIS — Z23 Encounter for immunization: Secondary | ICD-10-CM | POA: Diagnosis not present

## 2019-10-03 DIAGNOSIS — Z23 Encounter for immunization: Secondary | ICD-10-CM | POA: Diagnosis not present

## 2019-10-04 ENCOUNTER — Telehealth: Payer: Self-pay

## 2019-10-04 NOTE — Telephone Encounter (Signed)
Pt called about bill from 05/14/2017.  Insurance did not pay. She has been sent to Collections.  Spoke with [employer]. Stacy contacted the [place] - below was her  Response.    Marquis Diles,  I contacted [facility], and it was verified that the claim denied requesting coordination of benefits from the patient.  I was also told the claim was denied as COB investigational.  Their system triggered the COB request because of conflict of more than one insurance coverage.  They then mailed a COB questionnaire to the patient on 05/17/17.  The questionnaire was not received back per the rep.  The only account note of the patient calling regarding the denial of this claim was on 06/01/18.  I asked specifically if the patient gave that rep the requested COB information, and I was told no.   The patient received billing statements from 08/15/17-03/13/18 showing patient responsibility of $345.  Pt is aware of the above. She denies every getting a questionnaire. She feels like it is [place] responsibility to reach out to [place] for that information.   Pt appreciated the help. Still not happy.

## 2019-11-14 DIAGNOSIS — Z85528 Personal history of other malignant neoplasm of kidney: Secondary | ICD-10-CM | POA: Diagnosis not present

## 2019-11-14 DIAGNOSIS — E785 Hyperlipidemia, unspecified: Secondary | ICD-10-CM | POA: Diagnosis not present

## 2019-11-14 DIAGNOSIS — E559 Vitamin D deficiency, unspecified: Secondary | ICD-10-CM | POA: Diagnosis not present

## 2019-11-14 DIAGNOSIS — E039 Hypothyroidism, unspecified: Secondary | ICD-10-CM | POA: Diagnosis not present

## 2019-11-14 DIAGNOSIS — E271 Primary adrenocortical insufficiency: Secondary | ICD-10-CM | POA: Insufficient documentation

## 2019-11-14 DIAGNOSIS — Z0000 Encounter for general adult medical examination without abnormal findings: Secondary | ICD-10-CM | POA: Diagnosis not present

## 2019-11-14 DIAGNOSIS — Z114 Encounter for screening for human immunodeficiency virus [HIV]: Secondary | ICD-10-CM | POA: Diagnosis not present

## 2019-11-14 DIAGNOSIS — R002 Palpitations: Secondary | ICD-10-CM | POA: Diagnosis not present

## 2019-11-14 DIAGNOSIS — Z1159 Encounter for screening for other viral diseases: Secondary | ICD-10-CM | POA: Diagnosis not present

## 2019-12-12 DIAGNOSIS — E039 Hypothyroidism, unspecified: Secondary | ICD-10-CM | POA: Diagnosis not present

## 2019-12-12 DIAGNOSIS — E271 Primary adrenocortical insufficiency: Secondary | ICD-10-CM | POA: Diagnosis not present

## 2019-12-12 DIAGNOSIS — E559 Vitamin D deficiency, unspecified: Secondary | ICD-10-CM | POA: Diagnosis not present

## 2019-12-12 DIAGNOSIS — E785 Hyperlipidemia, unspecified: Secondary | ICD-10-CM | POA: Diagnosis not present

## 2019-12-13 ENCOUNTER — Emergency Department
Admission: EM | Admit: 2019-12-13 | Discharge: 2019-12-13 | Disposition: A | Discharge status: Home / Self Care | Payer: BC Managed Care – PPO | Attending: Emergency Medicine | Admitting: Emergency Medicine

## 2019-12-13 ENCOUNTER — Other Ambulatory Visit: Payer: Self-pay

## 2019-12-13 DIAGNOSIS — E271 Primary adrenocortical insufficiency: Secondary | ICD-10-CM

## 2019-12-13 DIAGNOSIS — E860 Dehydration: Secondary | ICD-10-CM | POA: Diagnosis not present

## 2019-12-13 DIAGNOSIS — A0811 Acute gastroenteropathy due to Norwalk agent: Secondary | ICD-10-CM | POA: Insufficient documentation

## 2019-12-13 DIAGNOSIS — Z79899 Other long term (current) drug therapy: Secondary | ICD-10-CM | POA: Diagnosis not present

## 2019-12-13 DIAGNOSIS — Z20822 Contact with and (suspected) exposure to covid-19: Secondary | ICD-10-CM | POA: Insufficient documentation

## 2019-12-13 DIAGNOSIS — Z85528 Personal history of other malignant neoplasm of kidney: Secondary | ICD-10-CM | POA: Diagnosis not present

## 2019-12-13 DIAGNOSIS — E039 Hypothyroidism, unspecified: Secondary | ICD-10-CM | POA: Diagnosis not present

## 2019-12-13 DIAGNOSIS — R110 Nausea: Secondary | ICD-10-CM | POA: Diagnosis not present

## 2019-12-13 DIAGNOSIS — R1084 Generalized abdominal pain: Secondary | ICD-10-CM | POA: Diagnosis not present

## 2019-12-13 DIAGNOSIS — K529 Noninfective gastroenteritis and colitis, unspecified: Secondary | ICD-10-CM | POA: Diagnosis not present

## 2019-12-13 DIAGNOSIS — Z905 Acquired absence of kidney: Secondary | ICD-10-CM | POA: Diagnosis not present

## 2019-12-13 DIAGNOSIS — R1110 Vomiting, unspecified: Secondary | ICD-10-CM | POA: Diagnosis not present

## 2019-12-13 DIAGNOSIS — R1111 Vomiting without nausea: Secondary | ICD-10-CM | POA: Diagnosis not present

## 2019-12-13 LAB — CBC WITH DIFFERENTIAL/PLATELET
Abs Immature Granulocytes: 0.06 10*3/uL (ref 0.00–0.07)
Basophils Absolute: 0.1 10*3/uL (ref 0.0–0.1)
Basophils Relative: 0 %
Eosinophils Absolute: 0 10*3/uL (ref 0.0–0.5)
Eosinophils Relative: 0 %
HCT: 49.8 % — ABNORMAL HIGH (ref 36.0–46.0)
Hemoglobin: 16.2 g/dL — ABNORMAL HIGH (ref 12.0–15.0)
Immature Granulocytes: 0 %
Lymphocytes Relative: 8 %
Lymphs Abs: 1.2 10*3/uL (ref 0.7–4.0)
MCH: 30.6 pg (ref 26.0–34.0)
MCHC: 32.5 g/dL (ref 30.0–36.0)
MCV: 94 fL (ref 80.0–100.0)
Monocytes Absolute: 0.8 10*3/uL (ref 0.1–1.0)
Monocytes Relative: 5 %
Neutro Abs: 13.6 10*3/uL — ABNORMAL HIGH (ref 1.7–7.7)
Neutrophils Relative %: 87 %
Platelets: 275 10*3/uL (ref 150–400)
RBC: 5.3 MIL/uL — ABNORMAL HIGH (ref 3.87–5.11)
RDW: 13 % (ref 11.5–15.5)
WBC: 15.8 10*3/uL — ABNORMAL HIGH (ref 4.0–10.5)
nRBC: 0 % (ref 0.0–0.2)

## 2019-12-13 LAB — COMPREHENSIVE METABOLIC PANEL
ALT: 20 U/L (ref 0–44)
AST: 21 U/L (ref 15–41)
Albumin: 4.5 g/dL (ref 3.5–5.0)
Alkaline Phosphatase: 47 U/L (ref 38–126)
Anion gap: 12 (ref 5–15)
BUN: 18 mg/dL (ref 8–23)
CO2: 23 mmol/L (ref 22–32)
Calcium: 9.3 mg/dL (ref 8.9–10.3)
Chloride: 107 mmol/L (ref 98–111)
Creatinine, Ser: 1.23 mg/dL — ABNORMAL HIGH (ref 0.44–1.00)
GFR calc Af Amer: 54 mL/min — ABNORMAL LOW (ref >60)
GFR calc non Af Amer: 47 mL/min — ABNORMAL LOW (ref >60)
Glucose, Bld: 96 mg/dL (ref 70–99)
Potassium: 3.6 mmol/L (ref 3.5–5.1)
Sodium: 142 mmol/L (ref 135–145)
Total Bilirubin: 0.8 mg/dL (ref 0.3–1.2)
Total Protein: 8.4 g/dL — ABNORMAL HIGH (ref 6.5–8.1)

## 2019-12-13 LAB — C DIFFICILE QUICK SCREEN W PCR REFLEX
C Diff antigen: NEGATIVE
C Diff interpretation: NOT DETECTED
C Diff toxin: NEGATIVE

## 2019-12-13 LAB — GASTROINTESTINAL PANEL BY PCR, STOOL (REPLACES STOOL CULTURE)

## 2019-12-13 LAB — RESPIRATORY PANEL BY RT PCR (FLU A&B, COVID)
Influenza A by PCR: NEGATIVE
Influenza B by PCR: NEGATIVE
SARS Coronavirus 2 by RT PCR: NEGATIVE

## 2019-12-13 LAB — MAGNESIUM: Magnesium: 2 mg/dL (ref 1.7–2.4)

## 2019-12-13 MED ORDER — LOPERAMIDE HCL 2 MG PO CAPS
4.0000 mg | ORAL_CAPSULE | Freq: Once | ORAL | Status: AC
  Administered 2019-12-13: 4 mg via ORAL
  Filled 2019-12-13: qty 2

## 2019-12-13 MED ORDER — ONDANSETRON 4 MG PO TBDP: 4.0000 mg | ORAL_TABLET | Freq: Three times a day (TID) | ORAL | 0 refills | Status: AC | PRN

## 2019-12-13 MED ORDER — FLUDROCORTISONE ACETATE 0.1 MG PO TABS
0.1000 mg | ORAL_TABLET | Freq: Every day | ORAL | Status: DC
  Administered 2019-12-13: 0.1 mg via ORAL
  Filled 2019-12-13 (×2): qty 1

## 2019-12-13 MED ORDER — SODIUM CHLORIDE 0.9 % IV BOLUS
1000.0000 mL | Freq: Once | INTRAVENOUS | Status: AC
  Administered 2019-12-13: 20:00:00 1000 mL via INTRAVENOUS

## 2019-12-13 MED ORDER — HYDROCORTISONE NA SUCCINATE PF 100 MG IJ SOLR
100.0000 mg | Freq: Once | INTRAMUSCULAR | Status: AC
  Administered 2019-12-13: 100 mg via INTRAVENOUS
  Filled 2019-12-13: qty 2

## 2019-12-13 MED ORDER — ONDANSETRON HCL 4 MG/2ML IJ SOLN
INTRAMUSCULAR | Status: AC
  Filled 2019-12-13: qty 2

## 2019-12-13 MED ORDER — ONDANSETRON HCL 4 MG/2ML IJ SOLN
4.0000 mg | Freq: Once | INTRAMUSCULAR | Status: AC
  Administered 2019-12-13: 4 mg via INTRAVENOUS

## 2019-12-13 MED ORDER — ONDANSETRON 4 MG PO TBDP
ORAL_TABLET | ORAL | Status: AC
  Filled 2019-12-13: qty 1

## 2019-12-13 NOTE — Discharge Instructions (Addendum)
Labs are reassuring.  Take the Zofran.    Return to Er for worsening weakness, low blood pressures or any others concers

## 2019-12-13 NOTE — ED Triage Notes (Signed)
Pt comes EMS from home with n/v, chills, and abdominal cramping since this am. Has hx of addison's and endocrinologist asked to give solumedrol.

## 2019-12-13 NOTE — ED Provider Notes (Signed)
[facility] Emergency Department Provider Note  ____________________________________________   First MD Initiated Contact with Patient 12/13/19 1928     (approximate)  I have reviewed the triage vital signs and the nursing notes.   HISTORY  Chief Complaint Emesis    HPI Caitlin Allison is a 64 y.o. female with Addison's disease, renal cancer status post kidney resection who comes in for emesis.  Patient reports since yesterday she has had some abdominal cramping associated with some nonbloody nonbilious vomiting as well as significant watery diarrhea.  Patient not take anything to help her symptoms, nothing makes them worse.  They have been intermittent for 1 day.  Denies a history of C. difficile or any recent antibiotics.  She states this happened once before from a prior viral infection had to be admitted for 4 days due to her Addison's.  She states that she feels very lightheaded at this time..  Patient does state that she is fully Covid vaccinated.  States she has had some family who is had a recent GI bug and thinks that is what she got.  Patient's family member is Dr. Forde Dandy who is her endocrinologist.  I did call him to confirm and he recommended to give some hydrocortisone and some fludrocortisone.  He stated that he had heard from the family members that she had not been taking her medicines and had been cutting down on that due to having some heart palpitations so he is not sure if the symptoms are secondary to medication noncompliance plus or minus with a viral illness.  Patient just had thyroid levels that were tested with a normal T4 and TSH of 5.44     Past Medical History:  Diagnosis Date  . Addison disease ([place])   . Addison disease ([place])   . Blood transfusion 1982  . Cancer ([place])    Hx of kidney cancer  . Hyperlipidemia   . Thyroid disease    hypo    Patient Active Problem List   Diagnosis Date Noted  . Vitamin D deficiency 05/18/2017     Past Surgical History:  Procedure Laterality Date  . KIDNEY SURGERY    . NEPHRECTOMY  2009   right  . TUBAL LIGATION  1984    Prior to Admission medications   Medication Sig Start Date End Date Taking? Authorizing Provider  hydrocortisone (CORTEF) 20 MG tablet Take 20 mg by mouth 2 (two) times daily.     [provider]  Multiple Vitamins-Minerals (MULTIVITAMIN WITH MINERALS) tablet Take 1 tablet by mouth daily.    [provider]  omeprazole (PRILOSEC) 20 MG capsule Take 1 capsule (20 mg total) by mouth daily. 02/09/18   McManama, Franchot Mimes, FNP  SYNTHROID 150 MCG tablet TAKE 1 TABLET (150 MCG TOTAL) BY MOUTH DAILY BEFORE BREAKFAST. 04/19/17   Versie Starks, PA-C  Vitamin D, Ergocalciferol, (DRISDOL) 50000 units CAPS capsule Take 1 capsule (50,000 Units total) by mouth every 7 (seven) days. 05/18/17   Joylene Igo, CNM    Allergies Patient has no known allergies.  Family History  Problem Relation Age of Onset  . Colon polyps Mother     Social History Social History   Tobacco Use  . Smoking status: Never Smoker  . Smokeless tobacco: Never Used  Substance Use Topics  . Alcohol use: No  . Drug use: No      Review of Systems Constitutional: No fever/chills Eyes: No visual changes. ENT: No sore throat. Cardiovascular:  Denies chest pain. Respiratory: Denies shortness of breath. Gastrointestinal: Abdominal cramping, nausea, vomiting, diarrhea Genitourinary: Negative for dysuria. Musculoskeletal: Negative for back pain. Skin: Negative for rash. Neurological: Negative for headaches, focal weakness or numbness. All other ROS negative ____________________________________________   PHYSICAL EXAM:  VITAL SIGNS: ED Triage Vitals  Enc Vitals Group     BP 12/13/19 1926 92/63     Pulse Rate 12/13/19 1926 87     Resp 12/13/19 1926 18     Temp 12/13/19 1926 98.7 F (37.1 C)     Temp Source 12/13/19 1926 Oral     SpO2 12/13/19 1926 100 %      Weight 12/13/19 1927 170 lb (77.1 kg)     Height 12/13/19 1927 5\' 6"  (1.676 m)     Head Circumference --      Peak Flow --      Pain Score 12/13/19 1927 0     Pain Loc --      Pain Edu? --      Excl. in [place]? --     Constitutional: Alert and oriented.  Patient does report feeling very cold Eyes: Conjunctivae are normal. EOMI. Head: Atraumatic. Nose: No congestion/rhinnorhea. Mouth/Throat: Mucous membranes are moist.   Neck: No stridor. Trachea Midline. FROM Cardiovascular: Normal rate, regular rhythm. Grossly normal heart sounds.  Good peripheral circulation. Respiratory: Normal respiratory effort.  No retractions. Lungs CTAB. Gastrointestinal: Soft and nontender. No distention. No abdominal bruits.  Musculoskeletal: No lower extremity tenderness nor edema.  No joint effusions. Neurologic:  Normal speech and language. No gross focal neurologic deficits are appreciated.  Skin:  Skin is warm, dry and intact. No rash noted. Psychiatric: Mood and affect are normal. Speech and behavior are normal. GU: Deferred   ____________________________________________   LABS (all labs ordered are listed, but only abnormal results are displayed)  Labs Reviewed  CBC WITH DIFFERENTIAL/PLATELET - Abnormal; Notable for the following components:      Result Value   WBC 15.8 (*)    RBC 5.30 (*)    Hemoglobin 16.2 (*)    HCT 49.8 (*)    Neutro Abs 13.6 (*)    All other components within normal limits  COMPREHENSIVE METABOLIC PANEL - Abnormal; Notable for the following components:   Creatinine, Ser 1.23 (*)    Total Protein 8.4 (*)    GFR calc non Af Amer 47 (*)    GFR calc Af Amer 54 (*)    All other components within normal limits  C DIFFICILE QUICK SCREEN W PCR REFLEX  RESPIRATORY PANEL BY RT PCR (FLU A&B, COVID)  GASTROINTESTINAL PANEL BY PCR, STOOL (REPLACES STOOL CULTURE)  MAGNESIUM  CORTISOL   ____________________________________________   ED ECG REPORT I, Vanessa [place], the  attending physician, personally viewed and interpreted this ECG.  EKG is normal sinus rhythm 96, no ST elevations, no T wave inversions, normal intervals, left anterior fascicular block ____________________________________________  RADIOLOGY  ____________________________________________   PROCEDURES  Procedure(s) performed (including Critical Care):  Procedures   ____________________________________________   INITIAL IMPRESSION / ASSESSMENT AND PLAN / ED COURSE  Caitlin Allison was evaluated in Emergency Department on 12/13/2019 for the symptoms described in the history of present illness. She was evaluated in the context of the global COVID-19 pandemic, which necessitated consideration that the patient might be at risk for infection with the SARS-CoV-2 virus that causes COVID-19. Institutional protocols and algorithms that pertain to the evaluation of patients at risk for COVID-19 are in a state  of rapid change based on information released by regulatory bodies including the CDC and federal and state organizations. These policies and algorithms were followed during the patient's care in the ED.     Patient is a 64 year old with Addison's and one kidney who comes in with nausea vomiting diarrhea.  Most likely viral in nature but will get stool studies and C. difficile testing although no risk factors for this.  Given her Addison's disease I did confirm with endocrinology on the correct stress test steroid for patient.  Will get labs to evaluate for Electra abnormalities, AKI and closely monitor patient's blood pressures.  Patient's abdomen at this time is soft nontender I will suspicion for appendicitis or acute abdominal pathologies I think to hold off on CT imaging.  Patient's white count significantly elevated at 15.8 concerning for infection  Kidney function up to 1.23 which is above her baseline and she has 1 kidney  Patient C. difficile was negative.  Patient requesting some  Imodium  Patient requesting to go home at this time.  Patient feeling much better.  Patient's blood pressures are back up.  Patient would like some Zofran to go home with.  Patient understands that she should come back if her symptoms are getting any worse.  Her cortisol level is still pending and her GI panel is still pending.  Patient states that she has been taking her cortisol and has not cut down on it.  She states that she had been adjusting her thyroid medicine but not her cortisol medicine.  At this time patient would like to be discharged home and I think that is reasonable given her vital signs are much better.  Continues to have a soft and nontender abdominal exam  I discussed the provisional nature of ED diagnosis, the treatment so far, the ongoing plan of care, follow up appointments and return precautions with the patient and any family or support people present. They expressed understanding and agreed with the plan, discharged home.       ____________________________________________   FINAL CLINICAL IMPRESSION(S) / ED DIAGNOSES   Final diagnoses:  None      MEDICATIONS GIVEN DURING THIS VISIT:  Medications  hydrocortisone sodium succinate (SOLU-CORTEF) 100 MG injection 100 mg (has no administration in time range)  fludrocortisone (FLORINEF) tablet 0.1 mg (has no administration in time range)  sodium chloride 0.9 % bolus 1,000 mL (has no administration in time range)     ED Discharge Orders    None       Note:  This document was prepared using Dragon voice recognition software and may include unintentional dictation errors.   Vanessa [place], MD 12/13/19 2204

## 2019-12-14 LAB — CORTISOL: Cortisol, Plasma: 1.8 ug/dL

## 2020-07-18 ENCOUNTER — Telehealth: Payer: Self-pay

## 2020-07-18 NOTE — Telephone Encounter (Signed)
mychart message sent to patient

## 2020-07-27 DIAGNOSIS — J029 Acute pharyngitis, unspecified: Secondary | ICD-10-CM | POA: Diagnosis not present

## 2020-07-27 DIAGNOSIS — Z20822 Contact with and (suspected) exposure to covid-19: Secondary | ICD-10-CM | POA: Diagnosis not present

## 2020-07-27 DIAGNOSIS — J111 Influenza due to unidentified influenza virus with other respiratory manifestations: Secondary | ICD-10-CM | POA: Diagnosis not present

## 2020-07-27 DIAGNOSIS — Z6826 Body mass index (BMI) 26.0-26.9, adult: Secondary | ICD-10-CM | POA: Diagnosis not present

## 2020-08-01 DIAGNOSIS — Z20822 Contact with and (suspected) exposure to covid-19: Secondary | ICD-10-CM | POA: Diagnosis not present

## 2021-02-17 DIAGNOSIS — E271 Primary adrenocortical insufficiency: Secondary | ICD-10-CM | POA: Diagnosis not present

## 2021-03-03 DIAGNOSIS — E310 Autoimmune polyglandular failure: Secondary | ICD-10-CM | POA: Diagnosis not present

## 2021-03-03 DIAGNOSIS — E559 Vitamin D deficiency, unspecified: Secondary | ICD-10-CM | POA: Diagnosis not present

## 2021-03-03 DIAGNOSIS — N1831 Chronic kidney disease, stage 3a: Secondary | ICD-10-CM | POA: Diagnosis not present

## 2021-03-03 DIAGNOSIS — C649 Malignant neoplasm of unspecified kidney, except renal pelvis: Secondary | ICD-10-CM | POA: Diagnosis not present

## 2021-03-03 DIAGNOSIS — E271 Primary adrenocortical insufficiency: Secondary | ICD-10-CM | POA: Diagnosis not present

## 2021-03-03 DIAGNOSIS — E785 Hyperlipidemia, unspecified: Secondary | ICD-10-CM | POA: Diagnosis not present

## 2021-03-03 DIAGNOSIS — E039 Hypothyroidism, unspecified: Secondary | ICD-10-CM | POA: Diagnosis not present

## 2021-06-16 DIAGNOSIS — E271 Primary adrenocortical insufficiency: Secondary | ICD-10-CM | POA: Diagnosis not present

## 2021-06-16 DIAGNOSIS — Z0000 Encounter for general adult medical examination without abnormal findings: Secondary | ICD-10-CM | POA: Diagnosis not present

## 2021-06-16 DIAGNOSIS — Z23 Encounter for immunization: Secondary | ICD-10-CM | POA: Diagnosis not present

## 2021-06-16 DIAGNOSIS — E7849 Other hyperlipidemia: Secondary | ICD-10-CM | POA: Diagnosis not present

## 2021-06-16 DIAGNOSIS — E039 Hypothyroidism, unspecified: Secondary | ICD-10-CM | POA: Diagnosis not present

## 2021-06-18 ENCOUNTER — Other Ambulatory Visit (HOSPITAL_BASED_OUTPATIENT_CLINIC_OR_DEPARTMENT_OTHER): Payer: Self-pay | Admitting: Internal Medicine

## 2021-06-18 ENCOUNTER — Other Ambulatory Visit: Payer: Self-pay | Admitting: Internal Medicine

## 2021-06-18 DIAGNOSIS — I83891 Varicose veins of right lower extremities with other complications: Secondary | ICD-10-CM

## 2021-06-18 DIAGNOSIS — I8001 Phlebitis and thrombophlebitis of superficial vessels of right lower extremity: Secondary | ICD-10-CM

## 2021-06-18 DIAGNOSIS — Z0000 Encounter for general adult medical examination without abnormal findings: Secondary | ICD-10-CM

## 2021-06-23 ENCOUNTER — Ambulatory Visit
Admission: RE | Admit: 2021-06-23 | Discharge: 2021-06-23 | Disposition: A | Discharge status: Home / Self Care | Payer: BC Managed Care – PPO | Source: Ambulatory Visit | Attending: Internal Medicine | Admitting: Internal Medicine

## 2021-06-23 ENCOUNTER — Other Ambulatory Visit: Payer: Self-pay

## 2021-06-23 DIAGNOSIS — I83891 Varicose veins of right lower extremities with other complications: Secondary | ICD-10-CM | POA: Diagnosis not present

## 2021-06-23 DIAGNOSIS — I8391 Asymptomatic varicose veins of right lower extremity: Secondary | ICD-10-CM | POA: Diagnosis not present

## 2021-06-23 DIAGNOSIS — Z0000 Encounter for general adult medical examination without abnormal findings: Secondary | ICD-10-CM | POA: Insufficient documentation

## 2021-06-23 DIAGNOSIS — I8001 Phlebitis and thrombophlebitis of superficial vessels of right lower extremity: Secondary | ICD-10-CM | POA: Insufficient documentation

## 2021-07-22 ENCOUNTER — Other Ambulatory Visit (INDEPENDENT_AMBULATORY_CARE_PROVIDER_SITE_OTHER): Payer: Self-pay | Admitting: Nurse Practitioner

## 2021-07-22 DIAGNOSIS — I83891 Varicose veins of right lower extremities with other complications: Secondary | ICD-10-CM

## 2021-07-23 ENCOUNTER — Ambulatory Visit (INDEPENDENT_AMBULATORY_CARE_PROVIDER_SITE_OTHER): Payer: BC Managed Care – PPO | Admitting: Nurse Practitioner

## 2021-07-23 ENCOUNTER — Other Ambulatory Visit: Payer: Self-pay

## 2021-07-23 ENCOUNTER — Ambulatory Visit (INDEPENDENT_AMBULATORY_CARE_PROVIDER_SITE_OTHER): Payer: BC Managed Care – PPO

## 2021-07-23 DIAGNOSIS — I83891 Varicose veins of right lower extremities with other complications: Secondary | ICD-10-CM | POA: Diagnosis not present

## 2021-08-21 ENCOUNTER — Other Ambulatory Visit: Payer: Self-pay

## 2021-08-21 ENCOUNTER — Encounter (INDEPENDENT_AMBULATORY_CARE_PROVIDER_SITE_OTHER): Payer: Self-pay | Admitting: Nurse Practitioner

## 2021-08-21 ENCOUNTER — Ambulatory Visit (INDEPENDENT_AMBULATORY_CARE_PROVIDER_SITE_OTHER): Payer: BC Managed Care – PPO | Admitting: Nurse Practitioner

## 2021-08-21 VITALS — BP 139/81 | HR 75 | Resp 17 | Ht 66.0 in | Wt 172.0 lb

## 2021-08-21 DIAGNOSIS — Z85528 Personal history of other malignant neoplasm of kidney: Secondary | ICD-10-CM | POA: Insufficient documentation

## 2021-08-21 DIAGNOSIS — E7849 Other hyperlipidemia: Secondary | ICD-10-CM | POA: Insufficient documentation

## 2021-08-21 DIAGNOSIS — I83811 Varicose veins of right lower extremities with pain: Secondary | ICD-10-CM | POA: Diagnosis not present

## 2021-08-21 DIAGNOSIS — E039 Hypothyroidism, unspecified: Secondary | ICD-10-CM | POA: Insufficient documentation

## 2021-08-31 ENCOUNTER — Encounter (INDEPENDENT_AMBULATORY_CARE_PROVIDER_SITE_OTHER): Payer: Self-pay | Admitting: Nurse Practitioner

## 2021-08-31 NOTE — Progress Notes (Signed)
Subjective:    Patient ID: Caitlin Allison, female    DOB: 25-Jan-1956, 66 y.o.   MRN: [id number] Chief Complaint  Patient presents with   Follow-up    From ultrasound    Caitlin Allison is a 66 year old female that is seen for evaluation of symptomatic varicose veins. The patient relates burning and stinging which worsened steadily throughout the course of the day, particularly with standing. The patient also notes an aching and throbbing pain over the varicosities, particularly with prolonged dependent positions. The symptoms are significantly improved with elevation.  The patient also notes that during hot weather the symptoms are greatly intensified. The patient states the pain from the varicose veins interferes with work, daily exercise, shopping and household maintenance. At this point, the symptoms are persistent and severe enough that they're having a negative impact on lifestyle and are interfering with daily activities.  There is no history of DVT, PE or superficial thrombophlebitis. There is no history of ulceration or hemorrhage. The patient denies a significant family history of varicose veins. OB history: G2P2  The patient has worn graduated compression in the past.,  And notes that it is only minimally helpful.  At the present time the patient has been using over-the-counter analgesics. There is no history of prior surgical intervention or sclerotherapy.  Today noninvasive studies show evidence of deep venous insufficiency.  There is also evidence of reflux in the right great saphenous vein.     Review of Systems  Cardiovascular:  Positive for leg swelling.  All other systems reviewed and are negative.     Objective:   Physical Exam Vitals reviewed.  HENT:     Head: Normocephalic.  Cardiovascular:     Rate and Rhythm: Normal rate.     Pulses: Normal pulses.  Pulmonary:     Effort: Pulmonary effort is normal.  Musculoskeletal:        General: Tenderness present.      Right lower leg: 1+ Edema present.  Skin:    General: Skin is warm and dry.  Neurological:     Mental Status: She is alert and oriented to person, place, and time.  Psychiatric:        Mood and Affect: Mood normal.        Behavior: Behavior normal.        Thought Content: Thought content normal.    BP 139/81 (BP Location: Left Arm)    Pulse 75    Resp 17    Ht 5\' 6"  (1.676 m)    Wt 172 lb (78 kg)    BMI 27.76 kg/m   Past Medical History:  Diagnosis Date   Addison disease ([place])    Addison disease ([place])    Blood transfusion 1982   Cancer ([place])    Hx of kidney cancer   Hyperlipidemia    Thyroid disease    hypo    Social History   Socioeconomic History   Marital status: Married    Spouse name: Not on file   Number of children: Not on file   Years of education: Not on file   Highest education level: Not on file  Occupational History   Not on file  Tobacco Use   Smoking status: Never   Smokeless tobacco: Never  Vaping Use   Vaping Use: Never used  Substance and Sexual Activity   Alcohol use: No   Drug use: No   Sexual activity: Not on file  Other Topics Concern  Not on file  Social History Narrative   Not on file   Social Determinants of Health   Financial Resource Strain: Not on file  Food Insecurity: Not on file  Transportation Needs: Not on file  Physical Activity: Not on file  Stress: Not on file  Social Connections: Not on file  Intimate Partner Violence: Not on file    Past Surgical History:  Procedure Laterality Date   KIDNEY SURGERY     NEPHRECTOMY  2009   right   TUBAL LIGATION  1984    Family History  Problem Relation Age of Onset   Colon polyps Mother     No Known Allergies  CBC Latest Ref Rng & Units 12/13/2019 09/16/2017 12/23/2016  WBC 4.0 - 10.5 K/uL 15.8(H) 10.7 10.5  Hemoglobin 12.0 - 15.0 g/dL 16.2(H) 15.2 13.5  Hematocrit 36.0 - 46.0 % 49.8(H) 45.1 41.9  Platelets 150 - 400 K/uL 275 363 339      CMP     Component Value  Date/Time   NA 142 12/13/2019 1942   NA 141 09/16/2017 0926   NA 148 (H) 09/01/2011 0452   K 3.6 12/13/2019 1942   K 3.5 09/01/2011 0452   CL 107 12/13/2019 1942   CL 111 (H) 09/01/2011 0452   CO2 23 12/13/2019 1942   CO2 25 09/01/2011 0452   GLUCOSE 96 12/13/2019 1942   GLUCOSE 109 (H) 09/01/2011 0452   BUN 18 12/13/2019 1942   BUN 12 09/16/2017 0926   BUN 9 09/01/2011 0452   CREATININE 1.23 (H) 12/13/2019 1942   CREATININE 0.98 09/01/2011 0452   CALCIUM 9.3 12/13/2019 1942   CALCIUM 7.9 (L) 09/01/2011 0452   PROT 8.4 (H) 12/13/2019 1942   PROT 7.7 09/16/2017 0926   PROT 7.3 08/30/2011 1837   ALBUMIN 4.5 12/13/2019 1942   ALBUMIN 4.8 09/16/2017 0926   ALBUMIN 3.7 08/30/2011 1837   AST 21 12/13/2019 1942   AST 17 08/30/2011 1837   ALT 20 12/13/2019 1942   ALT 18 08/30/2011 1837   ALKPHOS 47 12/13/2019 1942   ALKPHOS 48 (L) 08/30/2011 1837   BILITOT 0.8 12/13/2019 1942   BILITOT 0.6 09/16/2017 0926   BILITOT 0.6 08/30/2011 1837   GFRNONAA 47 (L) 12/13/2019 1942   GFRNONAA >60 09/01/2011 0452   GFRAA 54 (L) 12/13/2019 1942   GFRAA >60 09/01/2011 0452     No results found.     Assessment & Plan:   1. Varicose veins of leg with pain, right Recommend  I have reviewed my previous  discussion with the patient regarding  varicose veins and why they cause symptoms. Patient will continue  wearing graduated compression stockings class 1 on a daily basis, beginning first thing in the morning and removing them in the evening.    In addition, behavioral modification including elevation during the day was again discussed and this will continue.  The patient has utilized over the counter pain medications and has been exercising.  However, at this time conservative therapy has not alleviated the patient's symptoms of leg pain and swelling  Recommend: laser ablation of the right great saphenous veins to eliminate the symptoms of pain and swelling of the lower extremities caused  by the severe superficial venous reflux disease.     Current Outpatient Medications on File Prior to Visit  Medication Sig Dispense Refill   hydrocortisone (CORTEF) 20 MG tablet Take 20 mg by mouth 2 (two) times daily.      Multiple Vitamins-Minerals (MULTIVITAMIN  WITH MINERALS) tablet Take 1 tablet by mouth daily.     SYNTHROID 150 MCG tablet TAKE 1 TABLET (150 MCG TOTAL) BY MOUTH DAILY BEFORE BREAKFAST. 30 tablet 3   Vitamin D, Ergocalciferol, (DRISDOL) 50000 units CAPS capsule Take 1 capsule (50,000 Units total) by mouth every 7 (seven) days. 30 capsule 1   omeprazole (PRILOSEC) 20 MG capsule Take 1 capsule (20 mg total) by mouth daily. (Patient not taking: Reported on 08/21/2021) 30 capsule 0   ondansetron (ZOFRAN ODT) 4 MG disintegrating tablet Take 1 tablet (4 mg total) by mouth every 8 (eight) hours as needed for nausea or vomiting. (Patient not taking: Reported on 08/21/2021) 20 tablet 0   No current facility-administered medications on file prior to visit.    There are no Patient Instructions on file for this visit. No follow-ups on file.   Kris Hartmann, NP

## 2021-09-23 ENCOUNTER — Emergency Department
Admission: EM | Admit: 2021-09-23 | Discharge: 2021-09-23 | Disposition: A | Discharge status: Home / Self Care | Payer: BC Managed Care – PPO | Attending: Emergency Medicine | Admitting: Emergency Medicine

## 2021-09-23 ENCOUNTER — Other Ambulatory Visit: Payer: Self-pay

## 2021-09-23 ENCOUNTER — Ambulatory Visit
Admission: EM | Admit: 2021-09-23 | Discharge: 2021-09-23 | Payer: BC Managed Care – PPO | Attending: Emergency Medicine | Admitting: Emergency Medicine

## 2021-09-23 DIAGNOSIS — K529 Noninfective gastroenteritis and colitis, unspecified: Secondary | ICD-10-CM | POA: Diagnosis not present

## 2021-09-23 DIAGNOSIS — E039 Hypothyroidism, unspecified: Secondary | ICD-10-CM | POA: Insufficient documentation

## 2021-09-23 DIAGNOSIS — E860 Dehydration: Secondary | ICD-10-CM

## 2021-09-23 DIAGNOSIS — R112 Nausea with vomiting, unspecified: Secondary | ICD-10-CM | POA: Diagnosis not present

## 2021-09-23 DIAGNOSIS — R55 Syncope and collapse: Secondary | ICD-10-CM | POA: Diagnosis not present

## 2021-09-23 DIAGNOSIS — E271 Primary adrenocortical insufficiency: Secondary | ICD-10-CM | POA: Diagnosis not present

## 2021-09-23 DIAGNOSIS — R0902 Hypoxemia: Secondary | ICD-10-CM | POA: Diagnosis not present

## 2021-09-23 DIAGNOSIS — R739 Hyperglycemia, unspecified: Secondary | ICD-10-CM | POA: Diagnosis not present

## 2021-09-23 MED ORDER — SODIUM CHLORIDE 0.9 % IV BOLUS: 500.0000 mL | Freq: Once | INTRAVENOUS | Status: DC

## 2021-09-23 MED ORDER — ONDANSETRON 8 MG PO TBDP: 8.0000 mg | ORAL_TABLET | Freq: Once | ORAL | Status: DC

## 2021-09-23 MED ORDER — ONDANSETRON HCL 4 MG/2ML IJ SOLN: 8.0000 mg | Freq: Once | INTRAMUSCULAR | Status: DC

## 2021-09-23 NOTE — ED Triage Notes (Signed)
Pt was around her grand daughter who had a stomach bug. Pt woke up this morning with vomiting and diarrhea.   Pt is with her daughter who states that she has Addisons disease and is missing a kidney. She states that she fears she is "bottoms out".   Pt is asking for a fluid IV and an injection of Solu-medrol per her endocrinologist.

## 2021-09-23 NOTE — ED Notes (Signed)
Patient is being discharged from the Urgent Care and sent to the Emergency Department via EMS . Per Alphonzo Cruise, MD, patient is in need of higher level of care due to dehydration. Patient is aware and verbalizes understanding of plan of care.  Vitals:   09/23/21 1023  Pulse: 81  Resp: 18  Temp: 97.7 F (36.5 C)  SpO2: 100%

## 2021-09-23 NOTE — ED Triage Notes (Signed)
Pt sent from [facility] urgent care with c/o N/V/D , concerned about addisions, they were not able to get an IV at the urgent care  EMS gave IV NS 529ml and solu medrol 125mg  min route, pt is now feeling better and is wanting to be discharged

## 2021-09-23 NOTE — ED Provider Triage Note (Signed)
Emergency Medicine Provider Triage Evaluation Note  Caitlin Allison, a 65 y.o. female  was evaluated in triage.  Pt complains of flare of Addison's disease. She reported with nausea, vomiting, and diarrhea since 4 am. She denies FCS, CP, SOB. LOC. She presents via EMS from [facility] urgent care, where she presented initially for symptoms.  She reports similar symptoms and her grand daughter.   Review of Systems  Positive: NVD Negative: FCS  Physical Exam  There were no vitals taken for this visit. Gen:   Awake, no distress   Resp:  Normal effort CTA MSK:   Moves extremities without difficulty  Other:  ABD: soft, nontender  Medical Decision Making  Medically screening exam initiated at 12:32 PM.  Appropriate orders placed.  Caitlin Allison was informed that the remainder of the evaluation will be completed by another provider, this initial triage assessment does not replace that evaluation, and the importance of remaining in the ED until their evaluation is complete.  Patient with history of Addison's disease, presents to the ED for symptoms including nausea, vomiting, and diarrhea.  S she presents to the ED from [facility] where she initially presented for intractable nausea and vomiting.  Patient was treated there with an IV bolus at time which an mL and an 8 mg IV dose of Zofran.  She presents to this ED in no acute distress, noting improvement of her symptoms overall.   Caitlin Needles, PA-C 09/23/21 1237

## 2021-09-23 NOTE — ED Notes (Signed)
First Nurse Note:  Pt to ED via [facility] from home for Nausea and vomiting and feel faint. Per EMS pt has hx/o Addison's disease and when she had these episodes she is supposed to get IV steroids. Pt was given Solumedrol 125 mg and has gotten 400 cc of fluids. Pt has 20 G IV in right hand  125/56 100% on RA HR 80

## 2021-09-23 NOTE — ED Notes (Signed)
EMS called for patient. Family at bedside.

## 2021-09-23 NOTE — Discharge Instructions (Signed)
Take the previously prescribed Zofran as needed.  Follow-up with primary provider or GI specialist for ongoing symptoms.  Return to the ED if needed.

## 2021-09-23 NOTE — ED Provider Notes (Addendum)
HPI  SUBJECTIVE:  Caitlin Allison is a 66 y.o. female who presents with the acute onset of 5-6 episodes of nonbilious, nonbloody emesis and over 10 episodes of watery, nonbloody diarrhea starting this morning.  Patient states that she is unable to tolerate any p.o. whatsoever.  She reports ports crampy periumbilical abdominal pain present only before vomiting, which resolves shortly thereafter.  She states that she feels weak, lightheaded, dizzy, nauseous.  No chest pain, shortness of breath.  No syncope.  She tried Pedialyte, Gatorade, Sprite, and doubling up on her usual steroid dose, but she vomited this up.  She denies COVID symptoms.  She states that her granddaughter, who she was around recently, recently got over a diarrheal illness.  She comes in for IV fluids, Zofran and a stress dose of Solu-Medrol, not sure of the dose.  Patient has a past medical history of blood transfusion, kidney cancer status post nephrectomy, hyperlipidemia, hypothyroidism, status post blood transfusion.  PMD: [facility].  Endocrinology: Dr. Forde Dandy at [facility]  Past Medical History:  Diagnosis Date   Addison disease ([place])    Addison disease ([place])    Blood transfusion 1982   Cancer ([place])    Hx of kidney cancer   Hyperlipidemia    Thyroid disease    hypo    Past Surgical History:  Procedure Laterality Date   KIDNEY SURGERY     NEPHRECTOMY  2009   right   TUBAL LIGATION  1984    Family History  Problem Relation Age of Onset   Colon polyps Mother     Social History   Tobacco Use   Smoking status: Never   Smokeless tobacco: Never  Vaping Use   Vaping Use: Never used  Substance Use Topics   Alcohol use: No   Drug use: No     Current Facility-Administered Medications:    ondansetron (ZOFRAN) injection 8 mg, 8 mg, Intramuscular, Once, Melynda Ripple, MD   sodium chloride 0.9 % bolus 500 mL, 500 mL, Intravenous, Once, Melynda Ripple, MD  Current Outpatient Medications:     hydrocortisone (CORTEF) 20 MG tablet, Take 20 mg by mouth 2 (two) times daily. , Disp: , Rfl:    Multiple Vitamins-Minerals (MULTIVITAMIN WITH MINERALS) tablet, Take 1 tablet by mouth daily., Disp: , Rfl:    SYNTHROID 150 MCG tablet, TAKE 1 TABLET (150 MCG TOTAL) BY MOUTH DAILY BEFORE BREAKFAST., Disp: 30 tablet, Rfl: 3   Vitamin D, Ergocalciferol, (DRISDOL) 50000 units CAPS capsule, Take 1 capsule (50,000 Units total) by mouth every 7 (seven) days., Disp: 30 capsule, Rfl: 1   omeprazole (PRILOSEC) 20 MG capsule, Take 1 capsule (20 mg total) by mouth daily. (Patient not taking: Reported on 08/21/2021), Disp: 30 capsule, Rfl: 0   ondansetron (ZOFRAN ODT) 4 MG disintegrating tablet, Take 1 tablet (4 mg total) by mouth every 8 (eight) hours as needed for nausea or vomiting. (Patient not taking: Reported on 08/21/2021), Disp: 20 tablet, Rfl: 0  No Known Allergies   ROS  As noted in HPI.   Physical Exam  Pulse 81    Temp 97.7 F (36.5 C) (Oral)    Resp 18    Ht 5\' 6"  (1.676 m)    Wt 77.1 kg    SpO2 100%    BMI 27.44 kg/m   Orthostatics: Lying 117/71 82 Sitting: 112/73 89 Standing: 109/80 108  Constitutional: Well developed, well nourished, appears ill Eyes: PERRL, EOMI, conjunctiva normal bilaterally HENT: Normocephalic, atraumatic,mucus membranes moist Respiratory: Clear  to auscultation bilaterally, no rales, no wheezing, no rhonchi Cardiovascular: Normal rate and rhythm, no murmurs, no gallops, no rubs.  Cap refill 2 to 3-seconds.  GI: Soft, nondistended, normal bowel sounds, nontender, no rebound, no guarding Back: no CVAT skin: No rash, skin intact Musculoskeletal: no deformities Neurologic: Alert & oriented x 3, CN III-XII grossly intact, no motor deficits, sensation grossly intact Psychiatric: Speech and behavior appropriate   ED Course   Medications  ondansetron (ZOFRAN) injection 8 mg (has no administration in time range)  sodium chloride 0.9 % bolus 500 mL (has no  administration in time range)    Orders Placed This Encounter  Procedures   Comprehensive metabolic panel    Standing Status:   Standing    Number of Occurrences:   1   CBC with Differential    Standing Status:   Standing    Number of Occurrences:   1   Insert peripheral IV    Standing Status:   Standing    Number of Occurrences:   1   No results found for this or any previous visit (from the past 24 hour(s)). No results found.  ED Clinical Impression  1. Gastroenteritis   2. Dehydration      ED Assessment/Plan  Patient is orthostatic, and clearly dehydrated.  Initially ordered CBC, CMP, 500 cc bolus of IV normal saline, and attempted to get in touch with Dr. Forde Dandy to find out what kind of steroid and the appropriate dose would be, however, he is not available today. Spoke with Dr. Baldwin Crown staff.  They have not seen the patient in 4 years.  They recommend that she go to the emergency department, where she can get the appropriate resuscitation and steroids per their protocol.  Patient requesting to go via EMS, daughter who brought her here, does not feel safe transporting her via private vehicle.  Will send via EMS.  We attempted to get an IV, and were unsuccessful.  Patient states that she would like to wait for EMS to try an IV.   Meds ordered this encounter  Medications   ondansetron (ZOFRAN) injection 8 mg   sodium chloride 0.9 % bolus 500 mL      *This clinic note was created using [employer]. Therefore, there may be occasional mistakes despite careful proofreading. ?    Melynda Ripple, MD 09/23/21 1111    Melynda Ripple, MD 09/23/21 1124

## 2021-10-02 NOTE — ED Provider Notes (Signed)
[facility] Emergency Department Provider Note     Event Date/Time   First MD Initiated Contact with Patient 09/23/21 1254     (approximate)   History   Emesis   HPI  Caitlin Allison is a 66 y.o. female with a history of Addison's disease, hypothyroidism, vitamin D deficiency, presents to the ED from a local urgent care.  Patient has had several days of nausea, vomiting, and diarrhea.  Because of her symptoms, she is been unable to take her usual dose of oral steroids, despite doubling the dose.  She tried intermittent fluid hydration but reports the vomiting is intractable.  Patient has similar symptoms to her grandmother, she recently visited.  She presented to a local urgent care for IV fluid hydration as well as a dose of Solu-Medrol.  Patient presents from [facility] as they were unable to establish an IV line.  They transported the patient via EMS to the ED for management.  Patient denies any frank chest pain, shortness of breath, headache, dizziness.  She reports that symptoms are worse substantially improved after IV fluid bolus and IV Solu-Medrol provided in route by EMS.     Physical Exam   Triage Vital Signs: ED Triage Vitals  Enc Vitals Group     BP 09/23/21 1250 108/64     Pulse Rate 09/23/21 1250 93     Resp 09/23/21 1250 17     Temp 09/23/21 1250 98 F (36.7 C)     Temp Source 09/23/21 1250 Oral     SpO2 09/23/21 1250 98 %     Weight --      Height --      Head Circumference --      Peak Flow --      Pain Score 09/23/21 1252 0     Pain Loc --      Pain Edu? --      Excl. in [place]? --     Most recent vital signs: Vitals:   09/23/21 1250  BP: 108/64  Pulse: 93  Resp: 17  Temp: 98 F (36.7 C)  SpO2: 98%    General Awake, no distress.  HEENT NCAT. PERRL. EOMI. No rhinorrhea. Mucous membranes are moist.  CV:  Good peripheral perfusion.  RESP:  Normal effort.  ABD:  No distention.   ED Results / Procedures /  Treatments   Labs (all labs ordered are listed, but only abnormal results are displayed) Labs Reviewed - No data to display   EKG   RADIOLOGY   No results found.   PROCEDURES:  Critical Care performed: No  Procedures   MEDICATIONS ORDERED IN ED: Medications - No data to display   IMPRESSION / MDM / [place] / ED COURSE  I reviewed the triage vital signs and the nursing notes.                              Differential diagnosis includes, but is not limited to, dehydration, viral illness, sepsis, UTI, electrolyte abnormality  Patient with a history of Addison's disease, presents with several days of persistent nausea, vomiting, and diarrhea.  Patient reports onset of symptoms after contact with her granddaughter.  Patient presents to the ED via EMS from local urgent care where she had a fluid bolus and IV Solu-Medrol in route.  Patient this point reports ports a significant movement of her symptoms.  She is  active, engaged, and alert.  Patient is requesting discharge from the ED at this time, citing that Solu-Medrol is typically all she needs to turn himself or around.  She denies any current symptoms and reports improved symptomology overall.  Patient without any ongoing N, V, D, is otherwise stable at this time are reassuring vital signs.  Patient has declined any lab draws at this time.  Patient's diagnosis is consistent with viral gastroenteritis given similar symptoms in the sick contact.  Patient is to follow up with primary provider as needed or otherwise directed. Patient is given ED precautions to return to the ED for any worsening or new symptoms.   FINAL CLINICAL IMPRESSION(S) / ED DIAGNOSES   Final diagnoses:  Nausea vomiting and diarrhea  Addison's disease ([place])     Rx / DC Orders   ED Discharge Orders     None        Note:  This document was prepared using Dragon voice recognition software and may include unintentional dictation  errors.    [name], PA-C 10/02/21 [id number]    Vanessa [place], MD 10/08/21 1030

## 2022-01-22 ENCOUNTER — Encounter: Payer: Self-pay | Admitting: Internal Medicine

## 2022-06-08 ENCOUNTER — Encounter (INDEPENDENT_AMBULATORY_CARE_PROVIDER_SITE_OTHER): Payer: Self-pay

## 2023-09-12 ENCOUNTER — Emergency Department
Admission: EM | Admit: 2023-09-12 | Discharge: 2023-09-12 | Discharge status: Against Medical Advice | Payer: Self-pay | Attending: Emergency Medicine | Admitting: Emergency Medicine

## 2023-09-12 DIAGNOSIS — Z5321 Procedure and treatment not carried out due to patient leaving prior to being seen by health care provider: Secondary | ICD-10-CM | POA: Insufficient documentation

## 2023-09-12 DIAGNOSIS — E272 Addisonian crisis: Secondary | ICD-10-CM | POA: Insufficient documentation

## 2023-09-12 NOTE — ED Triage Notes (Signed)
Pt to ED via [facility], with c/o adrenal crisis. Per EMS gave 125 solumedrol, 4mg  zofran and NS. Per EMS pt has returned to baseline and is now requesting to leave.   126/63 89HR 100% RA

## 2023-09-12 NOTE — ED Notes (Signed)
Pt ask to take IV out of her that EMS put in, removed IV. Pt states I am leaving, I got what I needed (talking about her IV fluids) Iv fluids from EMS was sodium chloride.
